# Patient Record
Sex: Female | Born: 1955 | Hispanic: No | Marital: Single | Smoking: Former smoker
Health system: Southern US, Community
[De-identification: ages and names within clinical notes are randomized; demographics above are authoritative.]

## PROBLEM LIST (undated history)

## (undated) DIAGNOSIS — J449 Chronic obstructive pulmonary disease, unspecified: Secondary | ICD-10-CM

## (undated) DIAGNOSIS — E079 Disorder of thyroid, unspecified: Secondary | ICD-10-CM

---

## 2002-10-07 ENCOUNTER — Other Ambulatory Visit: Admission: RE | Admit: 2002-10-07 | Discharge: 2002-10-07 | Payer: Self-pay | Admitting: Family Medicine

## 2020-09-09 ENCOUNTER — Other Ambulatory Visit: Payer: Self-pay

## 2020-09-09 ENCOUNTER — Inpatient Hospital Stay (HOSPITAL_COMMUNITY)
Admission: EM | Admit: 2020-09-09 | Discharge: 2020-09-24 | DRG: 177 | Disposition: A | Discharge status: Home / Self Care | Payer: BC Managed Care – PPO | Attending: Internal Medicine | Admitting: Internal Medicine

## 2020-09-09 ENCOUNTER — Encounter (HOSPITAL_COMMUNITY): Payer: Self-pay | Admitting: Emergency Medicine

## 2020-09-09 ENCOUNTER — Emergency Department (HOSPITAL_COMMUNITY): Payer: BC Managed Care – PPO

## 2020-09-09 DIAGNOSIS — Z79899 Other long term (current) drug therapy: Secondary | ICD-10-CM

## 2020-09-09 DIAGNOSIS — J440 Chronic obstructive pulmonary disease with acute lower respiratory infection: Secondary | ICD-10-CM | POA: Diagnosis present

## 2020-09-09 DIAGNOSIS — R0902 Hypoxemia: Secondary | ICD-10-CM

## 2020-09-09 DIAGNOSIS — J1282 Pneumonia due to coronavirus disease 2019: Secondary | ICD-10-CM | POA: Diagnosis present

## 2020-09-09 DIAGNOSIS — Z79890 Hormone replacement therapy (postmenopausal): Secondary | ICD-10-CM | POA: Diagnosis not present

## 2020-09-09 DIAGNOSIS — E079 Disorder of thyroid, unspecified: Secondary | ICD-10-CM | POA: Diagnosis present

## 2020-09-09 DIAGNOSIS — Z7952 Long term (current) use of systemic steroids: Secondary | ICD-10-CM

## 2020-09-09 DIAGNOSIS — Z885 Allergy status to narcotic agent status: Secondary | ICD-10-CM

## 2020-09-09 DIAGNOSIS — E039 Hypothyroidism, unspecified: Secondary | ICD-10-CM | POA: Diagnosis present

## 2020-09-09 DIAGNOSIS — U071 COVID-19: Principal | ICD-10-CM

## 2020-09-09 DIAGNOSIS — Z7951 Long term (current) use of inhaled steroids: Secondary | ICD-10-CM | POA: Diagnosis not present

## 2020-09-09 DIAGNOSIS — J9601 Acute respiratory failure with hypoxia: Secondary | ICD-10-CM | POA: Diagnosis present

## 2020-09-09 DIAGNOSIS — Z87891 Personal history of nicotine dependence: Secondary | ICD-10-CM

## 2020-09-09 HISTORY — DX: Disorder of thyroid, unspecified: E07.9

## 2020-09-09 HISTORY — DX: Chronic obstructive pulmonary disease, unspecified: J44.9

## 2020-09-09 LAB — TROPONIN I (HIGH SENSITIVITY)
Troponin I (High Sensitivity): 5 ng/L (ref <18)
Troponin I (High Sensitivity): 4 ng/L (ref <18)

## 2020-09-09 LAB — COMPREHENSIVE METABOLIC PANEL
ALT: 22 U/L (ref 0–44)
AST: 18 U/L (ref 15–41)
Albumin: 3.4 g/dL — ABNORMAL LOW (ref 3.5–5.0)
Alkaline Phosphatase: 53 U/L (ref 38–126)
Anion gap: 10 (ref 5–15)
BUN: 20 mg/dL (ref 8–23)
CO2: 23 mmol/L (ref 22–32)
Calcium: 8.7 mg/dL — ABNORMAL LOW (ref 8.9–10.3)
Chloride: 100 mmol/L (ref 98–111)
Creatinine, Ser: 0.87 mg/dL (ref 0.44–1.00)
GFR, Estimated: >60 mL/min (ref >60)
Glucose, Bld: 116 mg/dL — ABNORMAL HIGH (ref 70–99)
Potassium: 4.3 mmol/L (ref 3.5–5.1)
Sodium: 133 mmol/L — ABNORMAL LOW (ref 135–145)
Total Bilirubin: 0.7 mg/dL (ref 0.3–1.2)
Total Protein: 7.5 g/dL (ref 6.5–8.1)

## 2020-09-09 LAB — CBC WITH DIFFERENTIAL/PLATELET
Abs Immature Granulocytes: 0.02 10*3/uL (ref 0.00–0.07)
Basophils Absolute: 0 10*3/uL (ref 0.0–0.1)
Basophils Relative: 0 %
Eosinophils Absolute: 0 10*3/uL (ref 0.0–0.5)
Eosinophils Relative: 0 %
HCT: 36.7 % (ref 36.0–46.0)
Hemoglobin: 12.1 g/dL (ref 12.0–15.0)
Immature Granulocytes: 0 %
Lymphocytes Relative: 12 %
Lymphs Abs: 0.6 10*3/uL — ABNORMAL LOW (ref 0.7–4.0)
MCH: 31.8 pg (ref 26.0–34.0)
MCHC: 33 g/dL (ref 30.0–36.0)
MCV: 96.6 fL (ref 80.0–100.0)
Monocytes Absolute: 0.4 10*3/uL (ref 0.1–1.0)
Monocytes Relative: 7 %
Neutro Abs: 3.9 10*3/uL (ref 1.7–7.7)
Neutrophils Relative %: 81 %
Platelets: 162 10*3/uL (ref 150–400)
RBC: 3.8 MIL/uL — ABNORMAL LOW (ref 3.87–5.11)
RDW: 13.4 % (ref 11.5–15.5)
WBC: 4.8 10*3/uL (ref 4.0–10.5)
nRBC: 0 % (ref 0.0–0.2)

## 2020-09-09 LAB — RESP PANEL BY RT-PCR (FLU A&B, COVID) ARPGX2
Influenza A by PCR: NEGATIVE
Influenza B by PCR: NEGATIVE
SARS Coronavirus 2 by RT PCR: POSITIVE — AB

## 2020-09-09 LAB — POC SARS CORONAVIRUS 2 AG -  ED: SARS Coronavirus 2 Ag: NEGATIVE

## 2020-09-09 LAB — C-REACTIVE PROTEIN: CRP: 7.8 mg/dL — ABNORMAL HIGH (ref <1.0)

## 2020-09-09 LAB — PROCALCITONIN: Procalcitonin: 0.11 ng/mL

## 2020-09-09 LAB — FERRITIN: Ferritin: 323 ng/mL — ABNORMAL HIGH (ref 11–307)

## 2020-09-09 LAB — D-DIMER, QUANTITATIVE: D-Dimer, Quant: 0.76 ug/mL-FEU — ABNORMAL HIGH (ref 0.00–0.50)

## 2020-09-09 LAB — FIBRINOGEN: Fibrinogen: 581 mg/dL — ABNORMAL HIGH (ref 210–475)

## 2020-09-09 MED ORDER — SODIUM CHLORIDE 0.9 % IV SOLN
100.0000 mg | INTRAVENOUS | Status: AC
  Administered 2020-09-10 (×2): 100 mg via INTRAVENOUS
  Filled 2020-09-09 (×2): qty 20

## 2020-09-09 MED ORDER — PREDNISONE 20 MG PO TABS
50.0000 mg | ORAL_TABLET | Freq: Every day | ORAL | Status: DC
  Administered 2020-09-13 – 2020-09-15 (×3): 50 mg via ORAL
  Filled 2020-09-09 (×3): qty 1

## 2020-09-09 MED ORDER — ONDANSETRON HCL 4 MG PO TABS: 4.0000 mg | ORAL_TABLET | Freq: Four times a day (QID) | ORAL | Status: DC | PRN

## 2020-09-09 MED ORDER — SODIUM CHLORIDE 0.9 % IV SOLN: 100.0000 mg | Freq: Every day | INTRAVENOUS | Status: DC

## 2020-09-09 MED ORDER — METHYLPREDNISOLONE SODIUM SUCC 125 MG IJ SOLR
125.0000 mg | Freq: Once | INTRAMUSCULAR | Status: AC
  Administered 2020-09-09: 125 mg via INTRAVENOUS
  Filled 2020-09-09: qty 2

## 2020-09-09 MED ORDER — ACETAMINOPHEN 325 MG PO TABS
650.0000 mg | ORAL_TABLET | Freq: Once | ORAL | Status: AC | PRN
  Administered 2020-09-09: 650 mg via ORAL
  Filled 2020-09-09: qty 2

## 2020-09-09 MED ORDER — ENOXAPARIN SODIUM 40 MG/0.4ML ~~LOC~~ SOLN
40.0000 mg | SUBCUTANEOUS | Status: DC
  Administered 2020-09-10 – 2020-09-23 (×15): 40 mg via SUBCUTANEOUS
  Filled 2020-09-09 (×15): qty 0.4

## 2020-09-09 MED ORDER — ONDANSETRON HCL 4 MG/2ML IJ SOLN: 4.0000 mg | Freq: Four times a day (QID) | INTRAMUSCULAR | Status: DC | PRN

## 2020-09-09 MED ORDER — SODIUM CHLORIDE 0.9 % IV SOLN: 200.0000 mg | Freq: Once | INTRAVENOUS | Status: DC

## 2020-09-09 MED ORDER — SODIUM CHLORIDE 0.9 % IV SOLN
100.0000 mg | Freq: Every day | INTRAVENOUS | Status: AC
  Administered 2020-09-10 – 2020-09-13 (×4): 100 mg via INTRAVENOUS
  Filled 2020-09-09 (×4): qty 20

## 2020-09-09 MED ORDER — METHYLPREDNISOLONE SODIUM SUCC 125 MG IJ SOLR
1.0000 mg/kg | Freq: Two times a day (BID) | INTRAMUSCULAR | Status: AC
  Administered 2020-09-10 – 2020-09-12 (×5): 72.5 mg via INTRAVENOUS
  Filled 2020-09-09 (×5): qty 2

## 2020-09-09 MED ORDER — BARICITINIB 2 MG PO TABS
4.0000 mg | ORAL_TABLET | Freq: Every day | ORAL | Status: AC
  Administered 2020-09-10 – 2020-09-22 (×14): 4 mg via ORAL
  Filled 2020-09-09 (×14): qty 2

## 2020-09-09 MED ORDER — ACETAMINOPHEN 325 MG PO TABS: 650.0000 mg | ORAL_TABLET | Freq: Four times a day (QID) | ORAL | Status: DC | PRN

## 2020-09-09 NOTE — ED Provider Notes (Signed)
[facility] EMERGENCY DEPARTMENT Provider Note   CSN: [id number] Arrival date & time: 09/09/20  1514     History Chief Complaint  Patient presents with  . Hypoxia    Sarah Choi is a 65 y.o. female.  HPI Patient with known COPD presenting with cough, shortness of breath and chills for 1 week.  She states she had a flu immunization but not Covid immunizations.  She does not use oxygen at home.  No known sick contacts.  There are no other known modifying factors.    Past Medical History:  Diagnosis Date  . COPD (chronic obstructive pulmonary disease) ([place])   . Thyroid disease     There are no problems to display for this patient.   History reviewed. No pertinent surgical history.   OB History   No obstetric history on file.     History reviewed. No pertinent family history.  Social History   Tobacco Use  . Smoking status: Former Smoker    Packs/day: 0.50    Years: 31.00    Pack years: 15.50    Types: Cigarettes  . Smokeless tobacco: Never Used  . Tobacco comment: quit 15 yrs ago  Vaping Use  . Vaping Use: Never used  Substance Use Topics  . Alcohol use: Not Currently  . Drug use: Not Currently    Home Medications Prior to Admission medications   Not on File    Allergies    Codeine  Review of Systems   Review of Systems  All other systems reviewed and are negative.   Physical Exam Updated Vital Signs BP 116/61   Pulse 78   Temp 98.9 F (37.2 C) (Oral)   Resp 19   Ht 5' (1.524 m)   Wt 72.6 kg   SpO2 (!) 89%   BMI 31.25 kg/m   Physical Exam Vitals and nursing note reviewed.  Constitutional:      Appearance: She is well-developed and well-nourished.  HENT:     Head: Normocephalic and atraumatic.     Right Ear: External ear normal.     Left Ear: External ear normal.  Eyes:     Extraocular Movements: EOM normal.     Conjunctiva/sclera: Conjunctivae normal.     Pupils: Pupils are equal, round, and reactive to light.  Neck:      Trachea: Phonation normal.  Cardiovascular:     Rate and Rhythm: Normal rate.  Pulmonary:     Effort: Pulmonary effort is normal.  Chest:     Chest wall: No bony tenderness.  Abdominal:     General: There is no distension.  Musculoskeletal:        General: Normal range of motion.     Cervical back: Normal range of motion and neck supple.  Skin:    General: Skin is warm, dry and intact.  Neurological:     Mental Status: She is alert and oriented to person, place, and time.     Cranial Nerves: No cranial nerve deficit.     Sensory: No sensory deficit.     Motor: No abnormal muscle tone.     Coordination: Coordination normal.  Psychiatric:        Mood and Affect: Mood and affect and mood normal.        Behavior: Behavior normal.        Thought Content: Thought content normal.        Judgment: Judgment normal.     ED Results / Procedures / Treatments  Labs (all labs ordered are listed, but only abnormal results are displayed) Labs Reviewed  RESP PANEL BY RT-PCR (FLU A&B, COVID) ARPGX2 - Abnormal; Notable for the following components:      Result Value   SARS Coronavirus 2 by RT PCR POSITIVE (*)    All other components within normal limits  COMPREHENSIVE METABOLIC PANEL - Abnormal; Notable for the following components:   Sodium 133 (*)    Glucose, Bld 116 (*)    Calcium 8.7 (*)    Albumin 3.4 (*)    All other components within normal limits  CBC WITH DIFFERENTIAL/PLATELET - Abnormal; Notable for the following components:   RBC 3.80 (*)    Lymphs Abs 0.6 (*)    All other components within normal limits  FERRITIN - Abnormal; Notable for the following components:   Ferritin 323 (*)    All other components within normal limits  FIBRINOGEN - Abnormal; Notable for the following components:   Fibrinogen 581 (*)    All other components within normal limits  PROCALCITONIN  HIV ANTIBODY (ROUTINE TESTING W REFLEX)  POC SARS CORONAVIRUS 2 AG -  ED  TROPONIN I (HIGH SENSITIVITY)   TROPONIN I (HIGH SENSITIVITY)    EKG EKG Interpretation  Date/Time:  Thursday September 09 2020 15:55:58 EST Ventricular Rate:  101 PR Interval:  128 QRS Duration: 80 QT Interval:  328 QTC Calculation: 425 R Axis:   30 Text Interpretation: Sinus tachycardia Nonspecific ST and T wave abnormality Abnormal ECG No old tracing to compare Confirmed by Mancel Bale [phone]) on 09/09/2020 4:13:30 PM   Radiology DG Chest Port 1 View  Result Date: 09/09/2020 CLINICAL DATA:  Shortness of breath and cough beginning 10 days ago, history of COPD, former smoker EXAM: PORTABLE CHEST 1 VIEW COMPARISON:  Portable exam 1628 hours without priors available for comparison FINDINGS: Normal heart size, mediastinal contours, and pulmonary vascularity. Scattered infiltrates throughout both lungs, could represent pulmonary edema or atypical infection. Minimal central peribronchial thickening. No pleural effusion or pneumothorax. Osseous structures unremarkable. IMPRESSION: Scattered interstitial infiltrates throughout both lungs, question pulmonary edema versus atypical infection. Electronically Signed   By: Ulyses Southward M.D.   On: 09/09/2020 16:37    Procedures Procedures (including critical care time)  Medications Ordered in ED Medications  methylPREDNISolone sodium succinate (SOLU-MEDROL) 125 mg/2 mL injection 125 mg (has no administration in time range)  acetaminophen (TYLENOL) tablet 650 mg (650 mg Oral Given 09/09/20 1609)    ED Course  I have reviewed the triage vital signs and the nursing notes.  Pertinent labs & imaging results that were available during my care of the patient were reviewed by me and considered in my medical decision making (see chart for details).  Clinical Course as of 09/09/20 2215  Thu Sep 09, 2020  1930 Trial off oxygen, she desaturated to 77% on room air [EW]    Clinical Course User Index [EW] Mancel Bale, MD   MDM Rules/Calculators/A&P                            Patient Vitals for the past 24 hrs:  BP Temp Temp src Pulse Resp SpO2 Height Weight  09/09/20 2200 116/61 - - 78 19 (!) 89 % - -  09/09/20 2130 114/69 - - 79 19 (!) 88 % - -  09/09/20 2100 108/68 - - 82 15 (!) 89 % - -  09/09/20 2030 (!) 110/91 - - 79 (!) 21 Marland Kitchen)  84 % - -  09/09/20 2000 109/81 98.9 F (37.2 C) Oral 72 (!) 22 (!) 88 % - -  09/09/20 1910 109/79 - - 78 (!) 22 (!) 77 % - -  09/09/20 1552 - - - - - (!) 82 % - -  09/09/20 1544 (!) 100/59 (!) 101.3 F (38.5 C) Oral (!) 102 18 (!) 88 % 5' (1.524 m) 72.6 kg    10:15 PM Reevaluation with update and discussion. After initial assessment and treatment, an updated evaluation reveals no change in status, oxygenation okay 89% on nasal cannula oxygen at 4 L. Mancel Bale   Medical Decision Making:  This patient is presenting for evaluation of shortness of breath, cough, fever, which does require a range of treatment options, and is a complaint that involves a high risk of morbidity and mortality. The differential diagnoses include heart failure, pneumonia, COPD exacerbation. I decided to review old records, and in summary elderly female with history of COPD, not on oxygen presenting with hypoxia, and signs and symptoms of viral infection.  I did not require additional historical information from anyone.  Clinical Laboratory Tests Ordered, included CBC, Metabolic panel and Covid test, flu test, Covid inflammatory markers. Review indicates Covid infection present, mild inflammatory markers elevated. Radiologic Tests Ordered, included chest x-ray.  I independently Visualized: Radiographic images, which show multifocal pneumonia   Critical Interventions-clinical evaluation, oxygen to support normal oxygen saturation, laboratory testing, chest x-ray, observation, medication treatment, reassessment  After These Interventions, the Patient was reevaluated and was found to require hospitalization for hypoxia secondary to Covid infection.   Patient's been ill about 10 days now.  Anticipate she can be discharged on oxygen as soon as it is made available to her home  Sarah Choi was evaluated in Emergency Department on 09/09/2020 for the symptoms described in the history of present illness. She was evaluated in the context of the global COVID-19 pandemic, which necessitated consideration that the patient might be at risk for infection with the SARS-CoV-2 virus that causes COVID-19. Institutional protocols and algorithms that pertain to the evaluation of patients at risk for COVID-19 are in a state of rapid change based on information released by regulatory bodies including the CDC and federal and state organizations. These policies and algorithms were followed during the patient's care in the ED.   CRITICAL CARE-no Performed by: Mancel Bale  Nursing Notes Reviewed/ Care Coordinated Applicable Imaging Reviewed Interpretation of Laboratory Data incorporated into ED treatment  10:10 PM-Consult complete with hospitalist. Patient case explained and discussed.  He agrees to admit patient for further evaluation and treatment. Call ended at 10:50 PM    Final Clinical Impression(s) / ED Diagnoses Final diagnoses:  COVID-19 virus infection  Hypoxia    Rx / DC Orders ED Discharge Orders    None       Mancel Bale, MD 09/09/20 2216

## 2020-09-09 NOTE — ED Notes (Signed)
CRITICAL VALUE ALERT  Critical Value:  COVID POSITIVE  Date & Time Notied:  09/09/20 & 1750  Provider Notified: Dr. Effie Shy  Orders Received/Actions taken: Notified

## 2020-09-09 NOTE — ED Triage Notes (Addendum)
Pt c/o shortness of breath with a cough that began 10 days ago.  Pt has a hx of COPD and Oxygen saturations in the low 80's on RA.  Pt placed on 4L and sats rose to 90.

## 2020-09-10 ENCOUNTER — Other Ambulatory Visit: Payer: Self-pay

## 2020-09-10 ENCOUNTER — Encounter (HOSPITAL_COMMUNITY): Payer: Self-pay | Admitting: Internal Medicine

## 2020-09-10 DIAGNOSIS — J9601 Acute respiratory failure with hypoxia: Secondary | ICD-10-CM

## 2020-09-10 DIAGNOSIS — U071 COVID-19: Principal | ICD-10-CM

## 2020-09-10 LAB — HIV ANTIBODY (ROUTINE TESTING W REFLEX): HIV Screen 4th Generation wRfx: NONREACTIVE

## 2020-09-10 LAB — CBC WITH DIFFERENTIAL/PLATELET
Abs Immature Granulocytes: 0.01 10*3/uL (ref 0.00–0.07)
Basophils Absolute: 0 10*3/uL (ref 0.0–0.1)
Basophils Relative: 0 %
Eosinophils Absolute: 0 10*3/uL (ref 0.0–0.5)
Eosinophils Relative: 0 %
HCT: 38 % (ref 36.0–46.0)
Hemoglobin: 12.4 g/dL (ref 12.0–15.0)
Immature Granulocytes: 0 %
Lymphocytes Relative: 15 %
Lymphs Abs: 0.5 10*3/uL — ABNORMAL LOW (ref 0.7–4.0)
MCH: 31.6 pg (ref 26.0–34.0)
MCHC: 32.6 g/dL (ref 30.0–36.0)
MCV: 96.9 fL (ref 80.0–100.0)
Monocytes Absolute: 0.1 10*3/uL (ref 0.1–1.0)
Monocytes Relative: 3 %
Neutro Abs: 2.7 10*3/uL (ref 1.7–7.7)
Neutrophils Relative %: 82 %
Platelets: 153 10*3/uL (ref 150–400)
RBC: 3.92 MIL/uL (ref 3.87–5.11)
RDW: 13.3 % (ref 11.5–15.5)
WBC: 3.3 10*3/uL — ABNORMAL LOW (ref 4.0–10.5)
nRBC: 0 % (ref 0.0–0.2)

## 2020-09-10 LAB — COMPREHENSIVE METABOLIC PANEL
ALT: 18 U/L (ref 0–44)
AST: 17 U/L (ref 15–41)
Albumin: 3.3 g/dL — ABNORMAL LOW (ref 3.5–5.0)
Alkaline Phosphatase: 51 U/L (ref 38–126)
Anion gap: 11 (ref 5–15)
BUN: 18 mg/dL (ref 8–23)
CO2: 24 mmol/L (ref 22–32)
Calcium: 8.8 mg/dL — ABNORMAL LOW (ref 8.9–10.3)
Chloride: 104 mmol/L (ref 98–111)
Creatinine, Ser: 0.66 mg/dL (ref 0.44–1.00)
GFR, Estimated: >60 mL/min (ref >60)
Glucose, Bld: 160 mg/dL — ABNORMAL HIGH (ref 70–99)
Potassium: 4.1 mmol/L (ref 3.5–5.1)
Sodium: 139 mmol/L (ref 135–145)
Total Bilirubin: 0.5 mg/dL (ref 0.3–1.2)
Total Protein: 7.1 g/dL (ref 6.5–8.1)

## 2020-09-10 LAB — D-DIMER, QUANTITATIVE: D-Dimer, Quant: 0.7 ug/mL-FEU — ABNORMAL HIGH (ref 0.00–0.50)

## 2020-09-10 LAB — C-REACTIVE PROTEIN: CRP: 9.2 mg/dL — ABNORMAL HIGH (ref <1.0)

## 2020-09-10 LAB — PROCALCITONIN: Procalcitonin: 0.1 ng/mL

## 2020-09-10 MED ORDER — FLUTICASONE FUROATE-VILANTEROL 200-25 MCG/INH IN AEPB
1.0000 | INHALATION_SPRAY | Freq: Every day | RESPIRATORY_TRACT | Status: DC
  Administered 2020-09-10 – 2020-09-24 (×15): 1 via RESPIRATORY_TRACT

## 2020-09-10 MED ORDER — ASCORBIC ACID 500 MG PO TABS
500.0000 mg | ORAL_TABLET | Freq: Two times a day (BID) | ORAL | Status: DC
  Administered 2020-09-10 – 2020-09-24 (×28): 500 mg via ORAL
  Filled 2020-09-10 (×29): qty 1

## 2020-09-10 MED ORDER — ALBUTEROL SULFATE HFA 108 (90 BASE) MCG/ACT IN AERS: 2.0000 | INHALATION_SPRAY | RESPIRATORY_TRACT | Status: DC | PRN

## 2020-09-10 MED ORDER — FLUTICASONE-UMECLIDIN-VILANT 100-62.5-25 MCG/INH IN AEPB: 1.0000 | INHALATION_SPRAY | Freq: Every day | RESPIRATORY_TRACT | Status: DC

## 2020-09-10 MED ORDER — GUAIFENESIN ER 600 MG PO TB12
600.0000 mg | ORAL_TABLET | Freq: Two times a day (BID) | ORAL | Status: DC
  Administered 2020-09-10 – 2020-09-18 (×17): 600 mg via ORAL
  Filled 2020-09-10 (×17): qty 1

## 2020-09-10 MED ORDER — UMECLIDINIUM BROMIDE 62.5 MCG/INH IN AEPB
1.0000 | INHALATION_SPRAY | Freq: Every day | RESPIRATORY_TRACT | Status: DC
  Administered 2020-09-10 – 2020-09-24 (×15): 1 via RESPIRATORY_TRACT
  Filled 2020-09-10 (×2): qty 7

## 2020-09-10 MED ORDER — SODIUM CHLORIDE 0.9 % IV SOLN
INTRAVENOUS | Status: DC | PRN
  Administered 2020-09-10: 250 mL via INTRAVENOUS

## 2020-09-10 MED ORDER — PANTOPRAZOLE SODIUM 40 MG PO TBEC
40.0000 mg | DELAYED_RELEASE_TABLET | Freq: Every day | ORAL | Status: DC
  Administered 2020-09-10 – 2020-09-24 (×15): 40 mg via ORAL
  Filled 2020-09-10 (×15): qty 1

## 2020-09-10 MED ORDER — LEVOTHYROXINE SODIUM 112 MCG PO TABS
112.0000 ug | ORAL_TABLET | Freq: Every day | ORAL | Status: DC
  Administered 2020-09-10 – 2020-09-24 (×15): 112 ug via ORAL
  Filled 2020-09-10 (×15): qty 1

## 2020-09-10 MED ORDER — ZINC SULFATE 220 (50 ZN) MG PO CAPS
220.0000 mg | ORAL_CAPSULE | Freq: Every day | ORAL | Status: DC
  Administered 2020-09-10 – 2020-09-24 (×15): 220 mg via ORAL
  Filled 2020-09-10 (×15): qty 1

## 2020-09-10 MED ORDER — FLUTICASONE FUROATE-VILANTEROL 100-25 MCG/INH IN AEPB
1.0000 | INHALATION_SPRAY | Freq: Every day | RESPIRATORY_TRACT | Status: DC
  Filled 2020-09-10: qty 28

## 2020-09-10 NOTE — Progress Notes (Signed)
  Patient seen and evaluated, chart reviewed, please see EMR for updated orders. Please see full H&P dictated by admitting physician Dr Lyda Perone for same date of service.   Brief Summary:- 65 year old female with past medical history relevant for hypothyroidism and COPD, not previously on home O2 and not vaccinated against COVID-19 admitted on 09/10/2020 with acute hypoxic respiratory failure secondary to COVID-19 respiratory infection/Covid pneumonia   A/p  Acute hypoxic respiratory failure secondary to COVID-19 infection/Pneumonia--- The treatment plan and use of medications  for treatment of COVID-19 infection and possible side effects were discussed with patient -----Patient  verbalizes understanding and agrees to treatment protocols   --Patient is positive for COVID-19 infection, chest x-ray with findings of infiltrates/opacities,  patient is tachypneic/hypoxic and requiring continuous supplemental oxygen---patient meets criteria for initiation of Remdesivir AND Steroid therapy per protocol  --Check and trend inflammatory markers including D-dimer, ferritin and  CRP---also follow CBC and CMP --Supplemental oxygen to keep O2 sats above 93% -Follow serial chest x-rays and ABGs as indicated --- Encourage prone positioning for More than 16 hours/day in increments of 2 to 3 hours at a time if able to tolerate --Attempt to maintain euvolemic state --Zinc and vitamin C as ordered -Albuterol inhaler as needed -Accu-Cheks/fingersticks while on high-dose steroids -PPI while on high-dose steroids -Continue IV Solu-Medrol, p.o. baricitinib and IV remdesivir -Hypoxia persist patient is currently requiring high flow nasal cannula >> 10 Liters COVID-19 Labs  Recent Labs    09/09/20 2004 09/10/20 0506  DDIMER 0.76* 0.70*  FERRITIN 323*  --   CRP 7.8* 9.2*    Lab Results  Component Value Date   SARSCOV2NAA POSITIVE (A) 09/09/2020    2) hypothyroidism--continue levothyroxine 112 mcg  daily  3) social/ethics--patient is a full code with full scope of treatment  4)COPD--- steroids and bronchodilators as above in #1  --Total care time 42 minutes  Patient seen and evaluated, chart reviewed, please see EMR for updated orders. Please see full H&P dictated by admitting physician Dr Lyda Perone for same date of service.

## 2020-09-10 NOTE — H&P (Signed)
History and Physical    Sarah Choi RFF:[id number] DOB: 02-20-1956 DOA: 09/09/2020  PCP: [facility]  Patient coming from: Home  I have personally briefly reviewed patient's old medical records in [place]  Chief Complaint: Hypoxia  HPI: Sarah Choi is a 65 y.o. female with medical history significant of COPD, hypothyroidism.  Pt unvaccinated to [place].  Pt presents to ED with c/o cough, SOB, fever, chills for past 1 week.  Not normally on O2 at home.  No known sick contacts.  Symptoms are constant, severe, worsening.  Nothing makes better or worse.  No abd pain, no N/V/D.   ED Course: Initially 4L requirement, but now 81% on 4L, Improved to 89% on 8L.  Procalcitonin neg, WBC nl.  CRP 7.8.  D.Dimer 0.76.  CXR shows multifocal PNA.  COVID positive.   Review of Systems: As per HPI, otherwise all review of systems negative.  Past Medical History:  Diagnosis Date  . COPD (chronic obstructive pulmonary disease) ([place])   . Thyroid disease     History reviewed. No pertinent surgical history.   reports that she has quit smoking. Her smoking use included cigarettes. She has a 15.50 pack-year smoking history. She has never used smokeless tobacco. She reports previous alcohol use. She reports previous drug use.  Allergies  Allergen Reactions  . Codeine Other (See Comments)    Other reaction(s): Headache    History reviewed. No pertinent family history. No sick contacts.  Prior to Admission medications   Medication Sig Start Date End Date Taking? Authorizing Provider  azithromycin (ZITHROMAX) 250 MG tablet Take 250 mg by mouth as directed. 09/07/20  Yes [provider]  levothyroxine (SYNTHROID) 112 MCG tablet Take 112 mcg by mouth daily. 06/20/20  Yes [provider]  predniSONE (DELTASONE) 5 MG tablet Take 5 mg by mouth taper from 4 doses each day to 1 dose and stop. 09/07/20  Yes [provider]  TRELEGY ELLIPTA  100-62.5-25 MCG/INH AEPB Inhale 1 puff into the lungs daily. 08/15/20  Yes [provider]    Physical Exam: Vitals:   09/09/20 2100 09/09/20 2130 09/09/20 2200 09/09/20 2230  BP: 108/68 114/69 116/61 (!) 115/55  Pulse: 82 79 78 84  Resp: 15 19 19    Temp:      TempSrc:      SpO2: (!) 89% (!) 88% (!) 89% (!) 85%  Weight:      Height:        Constitutional: NAD, calm, comfortable Eyes: PERRL, lids and conjunctivae normal ENMT: Mucous membranes are moist. Posterior pharynx clear of any exudate or lesions.Normal dentition.  Neck: normal, supple, no masses, no thyromegaly Respiratory: clear to auscultation bilaterally, no wheezing, no crackles. Normal respiratory effort. No accessory muscle use.  Cardiovascular: Regular rate and rhythm, no murmurs / rubs / gallops. No extremity edema. 2+ pedal pulses. No carotid bruits.  Abdomen: no tenderness, no masses palpated. No hepatosplenomegaly. Bowel sounds positive.  Musculoskeletal: no clubbing / cyanosis. No joint deformity upper and lower extremities. Good ROM, no contractures. Normal muscle tone.  Skin: no rashes, lesions, ulcers. No induration Neurologic: CN 2-12 grossly intact. Sensation intact, DTR normal. Strength 5/5 in all 4.  Psychiatric: Normal judgment and insight. Alert and oriented x 3. Normal mood.    Labs on Admission: I have personally reviewed following labs and imaging studies  CBC: Recent Labs  Lab 09/09/20 2004  WBC 4.8  NEUTROABS 3.9  HGB 12.1  HCT 36.7  MCV 96.6  PLT 162   Basic Metabolic Panel: Recent Labs  Lab 09/09/20 2004  NA 133*  K 4.3  CL 100  CO2 23  GLUCOSE 116*  BUN 20  CREATININE 0.87  CALCIUM 8.7*   GFR: Estimated Creatinine Clearance: 58.1 mL/min (by C-G formula based on SCr of 0.87 mg/dL). Liver Function Tests: Recent Labs  Lab 09/09/20 2004  AST 18  ALT 22  ALKPHOS 53  BILITOT 0.7  PROT 7.5  ALBUMIN 3.4*   No results for input(s): LIPASE, AMYLASE in the last 168  hours. No results for input(s): AMMONIA in the last 168 hours. Coagulation Profile: No results for input(s): INR, PROTIME in the last 168 hours. Cardiac Enzymes: No results for input(s): CKTOTAL, CKMB, CKMBINDEX, TROPONINI in the last 168 hours. BNP (last 3 results) No results for input(s): PROBNP in the last 8760 hours. HbA1C: No results for input(s): HGBA1C in the last 72 hours. CBG: No results for input(s): GLUCAP in the last 168 hours. Lipid Profile: No results for input(s): CHOL, HDL, LDLCALC, TRIG, CHOLHDL, LDLDIRECT in the last 72 hours. Thyroid Function Tests: No results for input(s): TSH, T4TOTAL, FREET4, T3FREE, THYROIDAB in the last 72 hours. Anemia Panel: Recent Labs    09/09/20 2004  FERRITIN 323*   Urine analysis: No results found for: COLORURINE, APPEARANCEUR, LABSPEC, PHURINE, GLUCOSEU, HGBUR, BILIRUBINUR, KETONESUR, PROTEINUR, UROBILINOGEN, NITRITE, LEUKOCYTESUR  Radiological Exams on Admission: DG Chest Port 1 View  Result Date: 09/09/2020 CLINICAL DATA:  Shortness of breath and cough beginning 10 days ago, history of COPD, former smoker EXAM: PORTABLE CHEST 1 VIEW COMPARISON:  Portable exam 1628 hours without priors available for comparison FINDINGS: Normal heart size, mediastinal contours, and pulmonary vascularity. Scattered infiltrates throughout both lungs, could represent pulmonary edema or atypical infection. Minimal central peribronchial thickening. No pleural effusion or pneumothorax. Osseous structures unremarkable. IMPRESSION: Scattered interstitial infiltrates throughout both lungs, question pulmonary edema versus atypical infection. Electronically Signed   By: Ulyses Southward M.D.   On: 09/09/2020 16:37    EKG: Independently reviewed.  Assessment/Plan Principal Problem:   Acute hypoxemic respiratory failure due to COVID-19 (HCC)    1. Acute hypoxic resp failure due to COVID-19 1. COVID pathway 2. Cont pulse ox 3. remdesivir 4. Steroids 5. Starting  baricitinib given she is needing at least 8L at the moment. 6. Procalcitonin neg - doubt superimposed bacterial infection 7. Daily labs ordered  DVT prophylaxis: Lovenox Code Status: Full Family Communication: No family in room Disposition Plan: Home after O2 requirement improved Consults called: None Admission status: Admit to inpatient  Severity of Illness: The appropriate patient status for this patient is INPATIENT. Inpatient status is judged to be reasonable and necessary in order to provide the required intensity of service to ensure the patient's safety. The patient's presenting symptoms, physical exam findings, and initial radiographic and laboratory data in the context of their chronic comorbidities is felt to place them at high risk for further clinical deterioration. Furthermore, it is not anticipated that the patient will be medically stable for discharge from the hospital within 2 midnights of admission. The following factors support the patient status of inpatient.   IP status due to COVID-19 with 8L O2 requirement.  * I certify that at the point of admission it is my clinical judgment that the patient will require inpatient hospital care spanning beyond 2 midnights from the point of admission due to high intensity of service, high risk for further deterioration and high frequency of surveillance required.*  Kemari Narez Judie Petit DO [facility]  How to contact the [facility] Attending or Consulting provider 7A - 7P or covering provider during after hours 7P -7A, for this patient?  1. Check the care team in [facility] and look for a) attending/consulting [facility] provider listed and b) the [facility] team listed 2. Log into [email or web address]  Amion Physician Scheduling and messaging for groups and whole hospitals  On call and physician scheduling software for group practices, residents, hospitalists and other medical providers for call, clinic, rotation and shift schedules. On[employer] is a  hospital-wide system for scheduling doctors and paging doctors on call. EasyPlot is for scientific plotting and data analysis.  [email or web address]  and use [place]'s universal password to access. If you do not have the password, please contact the hospital operator.  3. Locate the [facility] provider you are looking for under [facility] and page to a number that you can be directly reached. 4. If you still have difficulty reaching the provider, please page the [facility] (Director on Call) for the Hospitalists listed on amion for assistance.  09/10/2020, 12:00 AM

## 2020-09-11 DIAGNOSIS — J9601 Acute respiratory failure with hypoxia: Secondary | ICD-10-CM | POA: Diagnosis not present

## 2020-09-11 DIAGNOSIS — U071 COVID-19: Secondary | ICD-10-CM | POA: Diagnosis not present

## 2020-09-11 LAB — COMPREHENSIVE METABOLIC PANEL
ALT: 21 U/L (ref 0–44)
AST: 15 U/L (ref 15–41)
Albumin: 3.1 g/dL — ABNORMAL LOW (ref 3.5–5.0)
Alkaline Phosphatase: 49 U/L (ref 38–126)
Anion gap: 9 (ref 5–15)
BUN: 32 mg/dL — ABNORMAL HIGH (ref 8–23)
CO2: 23 mmol/L (ref 22–32)
Calcium: 9 mg/dL (ref 8.9–10.3)
Chloride: 105 mmol/L (ref 98–111)
Creatinine, Ser: 0.79 mg/dL (ref 0.44–1.00)
GFR, Estimated: >60 mL/min (ref >60)
Glucose, Bld: 137 mg/dL — ABNORMAL HIGH (ref 70–99)
Potassium: 3.8 mmol/L (ref 3.5–5.1)
Sodium: 137 mmol/L (ref 135–145)
Total Bilirubin: 0.5 mg/dL (ref 0.3–1.2)
Total Protein: 6.8 g/dL (ref 6.5–8.1)

## 2020-09-11 LAB — C-REACTIVE PROTEIN: CRP: 5.1 mg/dL — ABNORMAL HIGH (ref <1.0)

## 2020-09-11 LAB — CBC WITH DIFFERENTIAL/PLATELET
Abs Immature Granulocytes: 0.02 10*3/uL (ref 0.00–0.07)
Basophils Absolute: 0 10*3/uL (ref 0.0–0.1)
Basophils Relative: 0 %
Eosinophils Absolute: 0 10*3/uL (ref 0.0–0.5)
Eosinophils Relative: 0 %
HCT: 38.7 % (ref 36.0–46.0)
Hemoglobin: 12.4 g/dL (ref 12.0–15.0)
Immature Granulocytes: 0 %
Lymphocytes Relative: 12 %
Lymphs Abs: 0.7 10*3/uL (ref 0.7–4.0)
MCH: 31.2 pg (ref 26.0–34.0)
MCHC: 32 g/dL (ref 30.0–36.0)
MCV: 97.2 fL (ref 80.0–100.0)
Monocytes Absolute: 0.4 10*3/uL (ref 0.1–1.0)
Monocytes Relative: 7 %
Neutro Abs: 4.9 10*3/uL (ref 1.7–7.7)
Neutrophils Relative %: 81 %
Platelets: 221 10*3/uL (ref 150–400)
RBC: 3.98 MIL/uL (ref 3.87–5.11)
RDW: 13.3 % (ref 11.5–15.5)
WBC: 6.1 10*3/uL (ref 4.0–10.5)
nRBC: 0 % (ref 0.0–0.2)

## 2020-09-11 LAB — D-DIMER, QUANTITATIVE: D-Dimer, Quant: 0.58 ug/mL-FEU — ABNORMAL HIGH (ref 0.00–0.50)

## 2020-09-11 NOTE — Progress Notes (Signed)
PROGRESS NOTE  Sarah Choi  DOB: 1955-12-23  PCP: [facility] QIO:[id number]  DOA: 09/09/2020  LOS: 2 days   Chief Complaint  Patient presents with  . Hypoxia    Brief narrative: Sarah Choi is a 65 y.o. female with PMH significant for hypothyroidism, COPD, unvaccinated to Covid. Patient presented to the ED on 1/6 with complaint of shortness of breath and cough that began 10 days ago.   In the ED, oxygen saturation noted to be 80% on room air, required 4 L by nasal cannula to maintain more than 90%. Covid PCR positive.  Procalcitonin negative, CRP elevated Chest x-ray showed scattered interstitial infiltrates throughout both lungs Patient was admitted for respiratory failure secondary to Covid related pneumonia.  Subjective: Patient was seen and examined this morning. Pleasant middle-aged Caucasian female.  Looks older for her age. On 15 L oxygen by nasal cannula, oxygen saturation more than 90%. She states she feels fine.  She regrets not taking vaccine but plans to take it once he manages to ride this illness over.  Assessment/Plan: COVID pneumonia Acute respiratory failure with hypoxia  -Presented with progressively worsening cough, shortness of breath -COVID test: PCR positive on admission -Chest imaging: Multifocal pneumonia  -Treatment: Currently on a 5-day course of IV remdesivir till 1/10, Solu-Medrol IV twice daily.  Since 1/7, patient was also started on baricitinib. -Progression: Seems to have worsening oxygen requirement.  However see states he feels better.  Inflammatory markers gradually improving. -Continue to monitor -Oxygen - SpO2: 92 % O2 Flow Rate (L/min): 15 L/min -Supportive care: Vitamin C, Zinc, PRN inhalers, Tylenol, Antitussives (benzonatate/ Mucinex/Tussionex).   -Encouraged incentive spirometry, prone position, out of bed and early mobilization as much as possible -Continue airborne/contact isolation precautions for duration of 3 weeks  from the day of diagnosis. -WBC and inflammatory markers trend as below.  Recent Labs  Lab 09/09/20 1600 09/09/20 2004 09/09/20 2315 09/10/20 0506 09/11/20 0500  SARSCOV2NAA POSITIVE*  --   --   --   --   WBC  --  4.8  --  3.3* 6.1  PROCALCITON  --  0.11 0.10  --   --   DDIMER  --  0.76*  --  0.70* 0.58*  FERRITIN  --  323*  --   --   --   CRP  --  7.8*  --  9.2* 5.1*  ALT  --  22  --  18 21   The treatment plan and use of medications and known side effects were discussed with patient/family. Some of the medications used are based on case reports/anecdotal data.  All other medications being used in the management of COVID-19 based on limited study data.  Complete risks and long-term side effects are unknown, however in the best clinical judgment they seem to be of some benefit.  Patient wanted to proceed with treatment options provided.  Hypothyroidism -Continue Synthroid  COPD -On steroids and bronchodilators as stated above.  Mobility: Encourage ambulation once oxygenation improves Code Status:   Code Status: Full Code  Nutritional status: Body mass index is 31.25 kg/m.     Diet Order            Diet Heart Room service appropriate? Yes; Fluid consistency: Thin  Diet effective now                 DVT prophylaxis: enoxaparin (LOVENOX) injection 40 mg Start: 09/09/20 2300   Antimicrobials:  None Fluid: None Consultants: None Family Communication:  None  at bedside  Status is: Inpatient  Remains inpatient appropriate because: Ongoing treatment for Covid related pneumonia  Dispo: The patient is from: Home              Anticipated d/c is to: Home              Anticipated d/c date is: > 3 days              Patient currently is not medically stable to d/c.       Infusions:  . sodium chloride 250 mL (09/10/20 1743)  . remdesivir 100 mg in NS 100 mL 100 mg (09/11/20 0916)    Scheduled Meds: . vitamin C  500 mg Oral BID  . baricitinib  4 mg Oral Daily  .  enoxaparin (LOVENOX) injection  40 mg Subcutaneous Q24H  . fluticasone furoate-vilanterol  1 puff Inhalation Daily  . guaiFENesin  600 mg Oral BID  . levothyroxine  112 mcg Oral Daily  . methylPREDNISolone (SOLU-MEDROL) injection  1 mg/kg Intravenous Q12H   Followed by  . [START ON 09/13/2020] predniSONE  50 mg Oral Daily  . pantoprazole  40 mg Oral Daily  . umeclidinium bromide  1 puff Inhalation Daily  . zinc sulfate  220 mg Oral Daily    Antimicrobials: Anti-infectives (From admission, onward)   Start     Dose/Rate Route Frequency Ordered Stop   09/10/20 1800  remdesivir 100 mg in sodium chloride 0.9 % 100 mL IVPB       "Followed by" Linked Group Details   100 mg 200 mL/hr over 30 Minutes Intravenous Daily 09/09/20 2249 09/14/20 0959   09/10/20 1000  remdesivir 100 mg in sodium chloride 0.9 % 100 mL IVPB  Status:  Discontinued       "Followed by" Linked Group Details   100 mg 200 mL/hr over 30 Minutes Intravenous Daily 09/09/20 2247 09/09/20 2248   09/09/20 2300  remdesivir 200 mg in sodium chloride 0.9% 250 mL IVPB  Status:  Discontinued       "Followed by" Linked Group Details   200 mg 580 mL/hr over 30 Minutes Intravenous Once 09/09/20 2247 09/09/20 2248   09/09/20 2300  remdesivir 100 mg in sodium chloride 0.9 % 100 mL IVPB       "Followed by" Linked Group Details   100 mg 200 mL/hr over 30 Minutes Intravenous Every 30 min 09/09/20 2249 09/10/20 0204      PRN meds: sodium chloride, acetaminophen, albuterol, ondansetron **OR** ondansetron (ZOFRAN) IV   Objective: Vitals:   09/11/20 0450 09/11/20 0702  BP: 100/74   Pulse: 78   Resp: 19   Temp: 98 F (36.7 C)   SpO2: 91% 92%    Intake/Output Summary (Last 24 hours) at 09/11/2020 1509 Last data filed at 09/11/2020 1500 Gross per 24 hour  Intake 661.19 ml  Output --  Net 661.19 ml   Filed Weights   09/09/20 1544  Weight: 72.6 kg   Weight change:  Body mass index is 31.25 kg/m.   Physical Exam: General  exam: Pleasant.  Not in physical distress Skin: No rashes, lesions or ulcers. HEENT: Atraumatic, normocephalic, no obvious bleeding Lungs: Clear to auscultation bilaterally CVS: Regular rate and rhythm, no murmur GI/Abd soft, nontender, nondistended, bowel sound present CNS: Alert, awake, oriented x3 Psychiatry: Mood appropriate Extremities: No pedal edema, no calf tenderness  Data Review: I have personally reviewed the laboratory data and studies available.  Recent Labs  Lab 09/09/20 2004 09/10/20  0506 09/11/20 0500  WBC 4.8 3.3* 6.1  NEUTROABS 3.9 2.7 4.9  HGB 12.1 12.4 12.4  HCT 36.7 38.0 38.7  MCV 96.6 96.9 97.2  PLT 162 153 221   Recent Labs  Lab 09/09/20 2004 09/10/20 0506 09/11/20 0500  NA 133* 139 137  K 4.3 4.1 3.8  CL 100 104 105  CO2 23 24 23   GLUCOSE 116* 160* 137*  BUN 20 18 32*  CREATININE 0.87 0.66 0.79  CALCIUM 8.7* 8.8* 9.0    F/u labs ordered  Signed, , MD [facility] 09/11/2020

## 2020-09-11 NOTE — Plan of Care (Signed)

## 2020-09-12 DIAGNOSIS — J9601 Acute respiratory failure with hypoxia: Secondary | ICD-10-CM | POA: Diagnosis not present

## 2020-09-12 DIAGNOSIS — U071 COVID-19: Secondary | ICD-10-CM | POA: Diagnosis not present

## 2020-09-12 LAB — COMPREHENSIVE METABOLIC PANEL
ALT: 25 U/L (ref 0–44)
AST: 17 U/L (ref 15–41)
Albumin: 3.1 g/dL — ABNORMAL LOW (ref 3.5–5.0)
Alkaline Phosphatase: 48 U/L (ref 38–126)
Anion gap: 9 (ref 5–15)
BUN: 33 mg/dL — ABNORMAL HIGH (ref 8–23)
CO2: 23 mmol/L (ref 22–32)
Calcium: 9 mg/dL (ref 8.9–10.3)
Chloride: 109 mmol/L (ref 98–111)
Creatinine, Ser: 0.75 mg/dL (ref 0.44–1.00)
GFR, Estimated: >60 mL/min (ref >60)
Glucose, Bld: 137 mg/dL — ABNORMAL HIGH (ref 70–99)
Potassium: 3.7 mmol/L (ref 3.5–5.1)
Sodium: 141 mmol/L (ref 135–145)
Total Bilirubin: 0.5 mg/dL (ref 0.3–1.2)
Total Protein: 6.6 g/dL (ref 6.5–8.1)

## 2020-09-12 LAB — CBC WITH DIFFERENTIAL/PLATELET
Abs Immature Granulocytes: 0.04 10*3/uL (ref 0.00–0.07)
Basophils Absolute: 0 10*3/uL (ref 0.0–0.1)
Basophils Relative: 0 %
Eosinophils Absolute: 0 10*3/uL (ref 0.0–0.5)
Eosinophils Relative: 0 %
HCT: 38.3 % (ref 36.0–46.0)
Hemoglobin: 12.3 g/dL (ref 12.0–15.0)
Immature Granulocytes: 1 %
Lymphocytes Relative: 10 %
Lymphs Abs: 0.5 10*3/uL — ABNORMAL LOW (ref 0.7–4.0)
MCH: 31.4 pg (ref 26.0–34.0)
MCHC: 32.1 g/dL (ref 30.0–36.0)
MCV: 97.7 fL (ref 80.0–100.0)
Monocytes Absolute: 0.2 10*3/uL (ref 0.1–1.0)
Monocytes Relative: 4 %
Neutro Abs: 4.1 10*3/uL (ref 1.7–7.7)
Neutrophils Relative %: 85 %
Platelets: 227 10*3/uL (ref 150–400)
RBC: 3.92 MIL/uL (ref 3.87–5.11)
RDW: 13.3 % (ref 11.5–15.5)
WBC: 4.9 10*3/uL (ref 4.0–10.5)
nRBC: 0 % (ref 0.0–0.2)

## 2020-09-12 LAB — C-REACTIVE PROTEIN: CRP: 2.4 mg/dL — ABNORMAL HIGH (ref <1.0)

## 2020-09-12 LAB — D-DIMER, QUANTITATIVE: D-Dimer, Quant: 0.62 ug/mL-FEU — ABNORMAL HIGH (ref 0.00–0.50)

## 2020-09-12 LAB — MAGNESIUM: Magnesium: 2.4 mg/dL (ref 1.7–2.4)

## 2020-09-12 LAB — PHOSPHORUS: Phosphorus: 3.8 mg/dL (ref 2.5–4.6)

## 2020-09-12 NOTE — Plan of Care (Signed)

## 2020-09-12 NOTE — Progress Notes (Signed)
PROGRESS NOTE  Sarah Choi  DOB: [birth year]  PCP: [facility] EHU:[id number]  DOA: 09/09/2020  LOS: 3 days   Chief Complaint  Patient presents with  . Hypoxia    Brief narrative: Sarah Choi is a 65 y.o. female with PMH significant for hypothyroidism, COPD, unvaccinated to Covid. Patient presented to the ED on 1/6 with complaint of shortness of breath and cough that began 10 days ago.   In the ED, oxygen saturation noted to be 80% on room air, required 4 L by nasal cannula to maintain more than 90%. Covid PCR positive.  Procalcitonin negative, CRP elevated Chest x-ray showed scattered interstitial infiltrates throughout both lungs Patient was admitted for respiratory failure secondary to Covid related pneumonia.  Subjective: Currently she is on 15 L of oxygen.  She feels that breathing is slowly improving.  She was encouraged to lay in prone position.  She does have a cough.  Assessment/Plan: COVID pneumonia Acute respiratory failure with hypoxia  -Presented with progressively worsening cough, shortness of breath -COVID test: PCR positive on admission -Chest imaging: Multifocal pneumonia  -Treatment: Currently on a 5-day course of IV remdesivir till 1/10, Solu-Medrol IV twice daily.  Since 1/7, patient was also started on baricitinib. -Progression: Currently on 15L HFNC.  Inflammatory markers gradually improving. -Continue to monitor -Oxygen - SpO2: 94 % O2 Flow Rate (L/min): 12 L/min -Supportive care: Vitamin C, Zinc, PRN inhalers, Tylenol, Antitussives (benzonatate/ Mucinex/Tussionex).   -Encouraged incentive spirometry, prone position, out of bed and early mobilization as much as possible -Continue airborne/contact isolation precautions for duration of 3 weeks from the day of diagnosis. -WBC and inflammatory markers trend as below.  Recent Labs  Lab 09/09/20 1600 09/09/20 2004 09/09/20 2315 09/10/20 0506 09/11/20 0500 09/12/20 0959  SARSCOV2NAA  POSITIVE*  --   --   --   --   --   WBC  --  4.8  --  3.3* 6.1 4.9  PROCALCITON  --  0.11 0.10  --   --   --   DDIMER  --  0.76*  --  0.70* 0.58* 0.62*  FERRITIN  --  323*  --   --   --   --   CRP  --  7.8*  --  9.2* 5.1* 2.4*  ALT  --  22  --  18 21 25    The treatment plan and use of medications and known side effects were discussed with patient/family. Some of the medications used are based on case reports/anecdotal data.  All other medications being used in the management of COVID-19 based on limited study data.  Complete risks and long-term side effects are unknown, however in the best clinical judgment they seem to be of some benefit.  Patient wanted to proceed with treatment options provided.  Hypothyroidism -Continue Synthroid  COPD -On steroids and bronchodilators as stated above.  Mobility: Encourage ambulation once oxygenation improves Code Status:   Code Status: Full Code  Nutritional status: Body mass index is 31.25 kg/m.     Diet Order            Diet Heart Room service appropriate? Yes; Fluid consistency: Thin  Diet effective now                 DVT prophylaxis: enoxaparin (LOVENOX) injection 40 mg Start: 09/09/20 2300   Antimicrobials:  None Fluid: None Consultants: None Family Communication:  left VM for patient's daughter 1/9  Status is: Inpatient  Remains inpatient appropriate because: Ongoing treatment  for Covid related pneumonia  Dispo: The patient is from: Home              Anticipated d/c is to: Home              Anticipated d/c date is: > 3 days              Patient currently is not medically stable to d/c.       Infusions:  . sodium chloride 250 mL (09/10/20 1743)  . remdesivir 100 mg in NS 100 mL 100 mg (09/12/20 0959)    Scheduled Meds: . vitamin C  500 mg Oral BID  . baricitinib  4 mg Oral Daily  . enoxaparin (LOVENOX) injection  40 mg Subcutaneous Q24H  . fluticasone furoate-vilanterol  1 puff Inhalation Daily  . guaiFENesin   600 mg Oral BID  . levothyroxine  112 mcg Oral Daily  . methylPREDNISolone (SOLU-MEDROL) injection  1 mg/kg Intravenous Q12H   Followed by  . [START ON 09/13/2020] predniSONE  50 mg Oral Daily  . pantoprazole  40 mg Oral Daily  . umeclidinium bromide  1 puff Inhalation Daily  . zinc sulfate  220 mg Oral Daily    Antimicrobials: Anti-infectives (From admission, onward)   Start     Dose/Rate Route Frequency Ordered Stop   09/10/20 1800  remdesivir 100 mg in sodium chloride 0.9 % 100 mL IVPB       "Followed by" Linked Group Details   100 mg 200 mL/hr over 30 Minutes Intravenous Daily 09/09/20 2249 09/14/20 0959   09/10/20 1000  remdesivir 100 mg in sodium chloride 0.9 % 100 mL IVPB  Status:  Discontinued       "Followed by" Linked Group Details   100 mg 200 mL/hr over 30 Minutes Intravenous Daily 09/09/20 2247 09/09/20 2248   09/09/20 2300  remdesivir 200 mg in sodium chloride 0.9% 250 mL IVPB  Status:  Discontinued       "Followed by" Linked Group Details   200 mg 580 mL/hr over 30 Minutes Intravenous Once 09/09/20 2247 09/09/20 2248   09/09/20 2300  remdesivir 100 mg in sodium chloride 0.9 % 100 mL IVPB       "Followed by" Linked Group Details   100 mg 200 mL/hr over 30 Minutes Intravenous Every 30 min 09/09/20 2249 09/10/20 0204      PRN meds: sodium chloride, acetaminophen, albuterol, ondansetron **OR** ondansetron (ZOFRAN) IV   Objective: Vitals:   09/12/20 0734 09/12/20 1421  BP:  115/71  Pulse:  62  Resp:  18  Temp:  97.7 F (36.5 C)  SpO2: 93% 94%    Intake/Output Summary (Last 24 hours) at 09/12/2020 2032 Last data filed at 09/12/2020 1700 Gross per 24 hour  Intake 960 ml  Output --  Net 960 ml   Filed Weights   09/09/20 1544  Weight: 72.6 kg   Weight change:  Body mass index is 31.25 kg/m.   Physical Exam: General exam: Pleasant.  Not in physical distress Skin: No rashes, lesions or ulcers. HEENT: Atraumatic, normocephalic, no obvious bleeding Lungs:  Clear to auscultation bilaterally CVS: Regular rate and rhythm, no murmur GI/Abd soft, nontender, nondistended, bowel sound present CNS: Alert, awake, oriented x3 Psychiatry: Mood appropriate Extremities: No pedal edema, no calf tenderness  Data Review: I have personally reviewed the laboratory data and studies available.  Recent Labs  Lab 09/09/20 2004 09/10/20 0506 09/11/20 0500 09/12/20 0959  WBC 4.8 3.3* 6.1 4.9  NEUTROABS  3.9 2.7 4.9 4.1  HGB 12.1 12.4 12.4 12.3  HCT 36.7 38.0 38.7 38.3  MCV 96.6 96.9 97.2 97.7  PLT 162 153 221 227   Recent Labs  Lab 09/09/20 2004 09/10/20 0506 09/11/20 0500 09/12/20 0959  NA 133* 139 137 141  K 4.3 4.1 3.8 3.7  CL 100 104 105 109  CO2 23 24 23 23   GLUCOSE 116* 160* 137* 137*  BUN 20 18 32* 33*  CREATININE 0.87 0.66 0.79 0.75  CALCIUM 8.7* 8.8* 9.0 9.0  MG  --   --   --  2.4  PHOS  --   --   --  3.8    F/u labs ordered  Signed, , MD [facility] 09/12/2020

## 2020-09-13 DIAGNOSIS — J9601 Acute respiratory failure with hypoxia: Secondary | ICD-10-CM | POA: Diagnosis not present

## 2020-09-13 DIAGNOSIS — U071 COVID-19: Secondary | ICD-10-CM | POA: Diagnosis not present

## 2020-09-13 LAB — CBC WITH DIFFERENTIAL/PLATELET
Abs Immature Granulocytes: 0.08 10*3/uL — ABNORMAL HIGH (ref 0.00–0.07)
Basophils Absolute: 0 10*3/uL (ref 0.0–0.1)
Basophils Relative: 0 %
Eosinophils Absolute: 0 10*3/uL (ref 0.0–0.5)
Eosinophils Relative: 0 %
HCT: 35.1 % — ABNORMAL LOW (ref 36.0–46.0)
Hemoglobin: 11.4 g/dL — ABNORMAL LOW (ref 12.0–15.0)
Immature Granulocytes: 1 %
Lymphocytes Relative: 13 %
Lymphs Abs: 0.9 10*3/uL (ref 0.7–4.0)
MCH: 32 pg (ref 26.0–34.0)
MCHC: 32.5 g/dL (ref 30.0–36.0)
MCV: 98.6 fL (ref 80.0–100.0)
Monocytes Absolute: 0.6 10*3/uL (ref 0.1–1.0)
Monocytes Relative: 8 %
Neutro Abs: 5.3 10*3/uL (ref 1.7–7.7)
Neutrophils Relative %: 78 %
Platelets: 248 10*3/uL (ref 150–400)
RBC: 3.56 MIL/uL — ABNORMAL LOW (ref 3.87–5.11)
RDW: 13.2 % (ref 11.5–15.5)
WBC: 6.8 10*3/uL (ref 4.0–10.5)
nRBC: 0 % (ref 0.0–0.2)

## 2020-09-13 LAB — COMPREHENSIVE METABOLIC PANEL
ALT: 43 U/L (ref 0–44)
AST: 22 U/L (ref 15–41)
Albumin: 2.9 g/dL — ABNORMAL LOW (ref 3.5–5.0)
Alkaline Phosphatase: 45 U/L (ref 38–126)
Anion gap: 9 (ref 5–15)
BUN: 29 mg/dL — ABNORMAL HIGH (ref 8–23)
CO2: 23 mmol/L (ref 22–32)
Calcium: 8.7 mg/dL — ABNORMAL LOW (ref 8.9–10.3)
Chloride: 107 mmol/L (ref 98–111)
Creatinine, Ser: 0.73 mg/dL (ref 0.44–1.00)
GFR, Estimated: >60 mL/min (ref >60)
Glucose, Bld: 89 mg/dL (ref 70–99)
Potassium: 3.9 mmol/L (ref 3.5–5.1)
Sodium: 139 mmol/L (ref 135–145)
Total Bilirubin: 0.6 mg/dL (ref 0.3–1.2)
Total Protein: 6.1 g/dL — ABNORMAL LOW (ref 6.5–8.1)

## 2020-09-13 LAB — D-DIMER, QUANTITATIVE: D-Dimer, Quant: 0.61 ug/mL-FEU — ABNORMAL HIGH (ref 0.00–0.50)

## 2020-09-13 LAB — C-REACTIVE PROTEIN: CRP: 1.2 mg/dL — ABNORMAL HIGH (ref <1.0)

## 2020-09-13 NOTE — Progress Notes (Signed)
PROGRESS NOTE  Sarah Choi  DOB: [birth year]  PCP: [facility] ZOX:[id number]  DOA: 09/09/2020  LOS: 4 days   Chief Complaint  Patient presents with  . Hypoxia    Brief narrative: Sarah Choi is a 65 y.o. female with PMH significant for hypothyroidism, COPD, unvaccinated to Covid. Patient presented to the ED on 1/6 with complaint of shortness of breath and cough that began 10 days ago.   In the ED, oxygen saturation noted to be 80% on room air, required 4 L by nasal cannula to maintain more than 90%. Covid PCR positive.  Procalcitonin negative, CRP elevated Chest x-ray showed scattered interstitial infiltrates throughout both lungs Patient was admitted for respiratory failure secondary to Covid related pneumonia.  Subjective: Overnight, she was placed on nonrebreather mask in addition to 15 L nasal cannula.  She did not report any shortness of breath overnight.  This may have been possibly added due to low oxygen saturations.  She says she overall feels well  Assessment/Plan: COVID pneumonia Acute respiratory failure with hypoxia  -Presented with progressively worsening cough, shortness of breath -COVID test: PCR positive on admission -Chest imaging: Multifocal pneumonia  -Treatment: Currently on a 5-day course of IV remdesivir till 1/10, Solu-Medrol IV twice daily.  Since 1/7, patient was also started on baricitinib. -Progression: Currently on 12L HFNC.  Inflammatory markers gradually improving. -Continue to monitor -Oxygen - SpO2: 90 % O2 Flow Rate (L/min): 27 L/min -Supportive care: Vitamin C, Zinc, PRN inhalers, Tylenol, Antitussives (benzonatate/ Mucinex/Tussionex).   -Encouraged incentive spirometry, prone position, out of bed and early mobilization as much as possible -Continue airborne/contact isolation precautions for duration of 3 weeks from the day of diagnosis. -WBC and inflammatory markers trend as below.  Recent Labs  Lab 09/09/20 1600  09/09/20 2004 09/09/20 2315 09/10/20 0506 09/11/20 0500 09/12/20 0959 09/13/20 0600  SARSCOV2NAA POSITIVE*  --   --   --   --   --   --   WBC  --  4.8  --  3.3* 6.1 4.9 6.8  PROCALCITON  --  0.11 0.10  --   --   --   --   DDIMER  --  0.76*  --  0.70* 0.58* 0.62* 0.61*  FERRITIN  --  323*  --   --   --   --   --   CRP  --  7.8*  --  9.2* 5.1* 2.4* 1.2*  ALT  --  22  --  18 21 25  43   The treatment plan and use of medications and known side effects were discussed with patient/family. Some of the medications used are based on case reports/anecdotal data.  All other medications being used in the management of COVID-19 based on limited study data.  Complete risks and long-term side effects are unknown, however in the best clinical judgment they seem to be of some benefit.  Patient wanted to proceed with treatment options provided.  Hypothyroidism -Continue Synthroid  COPD -On steroids and bronchodilators as stated above.  Mobility: Encourage ambulation once oxygenation improves Code Status:   Code Status: Full Code  Nutritional status: Body mass index is 31.25 kg/m.     Diet Order            Diet Heart Room service appropriate? Yes; Fluid consistency: Thin  Diet effective now                 DVT prophylaxis: enoxaparin (LOVENOX) injection 40 mg Start: 09/09/20 2300  Antimicrobials:  None Fluid: None Consultants: None Family Communication:  left VM for patient's daughter 1/9  Status is: Inpatient  Remains inpatient appropriate because: Ongoing treatment for Covid related pneumonia  Dispo: The patient is from: Home              Anticipated d/c is to: Home              Anticipated d/c date is: > 3 days              Patient currently is not medically stable to d/c.       Infusions:  . sodium chloride 250 mL (09/10/20 1743)    Scheduled Meds: . vitamin C  500 mg Oral BID  . baricitinib  4 mg Oral Daily  . enoxaparin (LOVENOX) injection  40 mg Subcutaneous  Q24H  . fluticasone furoate-vilanterol  1 puff Inhalation Daily  . guaiFENesin  600 mg Oral BID  . levothyroxine  112 mcg Oral Daily  . pantoprazole  40 mg Oral Daily  . predniSONE  50 mg Oral Daily  . umeclidinium bromide  1 puff Inhalation Daily  . zinc sulfate  220 mg Oral Daily    Antimicrobials: Anti-infectives (From admission, onward)   Start     Dose/Rate Route Frequency Ordered Stop   09/10/20 1800  remdesivir 100 mg in sodium chloride 0.9 % 100 mL IVPB       "Followed by" Linked Group Details   100 mg 200 mL/hr over 30 Minutes Intravenous Daily 09/09/20 2249 09/13/20 1010   09/10/20 1000  remdesivir 100 mg in sodium chloride 0.9 % 100 mL IVPB  Status:  Discontinued       "Followed by" Linked Group Details   100 mg 200 mL/hr over 30 Minutes Intravenous Daily 09/09/20 2247 09/09/20 2248   09/09/20 2300  remdesivir 200 mg in sodium chloride 0.9% 250 mL IVPB  Status:  Discontinued       "Followed by" Linked Group Details   200 mg 580 mL/hr over 30 Minutes Intravenous Once 09/09/20 2247 09/09/20 2248   09/09/20 2300  remdesivir 100 mg in sodium chloride 0.9 % 100 mL IVPB       "Followed by" Linked Group Details   100 mg 200 mL/hr over 30 Minutes Intravenous Every 30 min 09/09/20 2249 09/10/20 0204      PRN meds: sodium chloride, acetaminophen, albuterol, ondansetron **OR** ondansetron (ZOFRAN) IV   Objective: Vitals:   09/13/20 1450 09/13/20 2105  BP:  122/68  Pulse:  (!) 57  Resp:    Temp:  97.8 F (36.6 C)  SpO2: 93% 90%    Intake/Output Summary (Last 24 hours) at 09/13/2020 2126 Last data filed at 09/13/2020 1200 Gross per 24 hour  Intake 480 ml  Output --  Net 480 ml   Filed Weights   09/09/20 1544  Weight: 72.6 kg   Weight change:  Body mass index is 31.25 kg/m.   Physical Exam: General exam: Alert, awake, oriented x 3 Respiratory system: Clear to auscultation. Respiratory effort normal. Cardiovascular system:RRR. No murmurs, rubs,  gallops. Gastrointestinal system: Abdomen is nondistended, soft and nontender. No organomegaly or masses felt. Normal bowel sounds heard. Central nervous system: Alert and oriented. No focal neurological deficits. Extremities: No C/C/E, +pedal pulses Skin: No rashes, lesions or ulcers Psychiatry: Judgement and insight appear normal. Mood & affect appropriate.    Data Review: I have personally reviewed the laboratory data and studies available.  Recent Labs  Lab 09/09/20  2004 09/10/20 0506 09/11/20 0500 09/12/20 0959 09/13/20 0600  WBC 4.8 3.3* 6.1 4.9 6.8  NEUTROABS 3.9 2.7 4.9 4.1 5.3  HGB 12.1 12.4 12.4 12.3 11.4*  HCT 36.7 38.0 38.7 38.3 35.1*  MCV 96.6 96.9 97.2 97.7 98.6  PLT 162 153 221 227 248   Recent Labs  Lab 09/09/20 2004 09/10/20 0506 09/11/20 0500 09/12/20 0959 09/13/20 0600  NA 133* 139 137 141 139  K 4.3 4.1 3.8 3.7 3.9  CL 100 104 105 109 107  CO2 23 24 23 23 23   GLUCOSE 116* 160* 137* 137* 89  BUN 20 18 32* 33* 29*  CREATININE 0.87 0.66 0.79 0.75 0.73  CALCIUM 8.7* 8.8* 9.0 9.0 8.7*  MG  --   --   --  2.4  --   PHOS  --   --   --  3.8  --     F/u labs ordered  Signed, , MD [facility] 09/13/2020

## 2020-09-14 DIAGNOSIS — J9601 Acute respiratory failure with hypoxia: Secondary | ICD-10-CM | POA: Diagnosis not present

## 2020-09-14 DIAGNOSIS — U071 COVID-19: Secondary | ICD-10-CM | POA: Diagnosis not present

## 2020-09-14 LAB — CBC WITH DIFFERENTIAL/PLATELET
Abs Immature Granulocytes: 0.19 10*3/uL — ABNORMAL HIGH (ref 0.00–0.07)
Basophils Absolute: 0 10*3/uL (ref 0.0–0.1)
Basophils Relative: 0 %
Eosinophils Absolute: 0 10*3/uL (ref 0.0–0.5)
Eosinophils Relative: 0 %
HCT: 36.4 % (ref 36.0–46.0)
Hemoglobin: 11.9 g/dL — ABNORMAL LOW (ref 12.0–15.0)
Immature Granulocytes: 2 %
Lymphocytes Relative: 18 %
Lymphs Abs: 1.6 10*3/uL (ref 0.7–4.0)
MCH: 32.1 pg (ref 26.0–34.0)
MCHC: 32.7 g/dL (ref 30.0–36.0)
MCV: 98.1 fL (ref 80.0–100.0)
Monocytes Absolute: 0.5 10*3/uL (ref 0.1–1.0)
Monocytes Relative: 6 %
Neutro Abs: 6.6 10*3/uL (ref 1.7–7.7)
Neutrophils Relative %: 74 %
Platelets: 260 10*3/uL (ref 150–400)
RBC: 3.71 MIL/uL — ABNORMAL LOW (ref 3.87–5.11)
RDW: 13.2 % (ref 11.5–15.5)
WBC: 8.9 10*3/uL (ref 4.0–10.5)
nRBC: 0 % (ref 0.0–0.2)

## 2020-09-14 LAB — COMPREHENSIVE METABOLIC PANEL
ALT: 39 U/L (ref 0–44)
AST: 16 U/L (ref 15–41)
Albumin: 3 g/dL — ABNORMAL LOW (ref 3.5–5.0)
Alkaline Phosphatase: 53 U/L (ref 38–126)
Anion gap: 8 (ref 5–15)
BUN: 28 mg/dL — ABNORMAL HIGH (ref 8–23)
CO2: 23 mmol/L (ref 22–32)
Calcium: 8.5 mg/dL — ABNORMAL LOW (ref 8.9–10.3)
Chloride: 107 mmol/L (ref 98–111)
Creatinine, Ser: 0.7 mg/dL (ref 0.44–1.00)
GFR, Estimated: >60 mL/min (ref >60)
Glucose, Bld: 89 mg/dL (ref 70–99)
Potassium: 3.6 mmol/L (ref 3.5–5.1)
Sodium: 138 mmol/L (ref 135–145)
Total Bilirubin: 0.8 mg/dL (ref 0.3–1.2)
Total Protein: 6.3 g/dL — ABNORMAL LOW (ref 6.5–8.1)

## 2020-09-14 LAB — D-DIMER, QUANTITATIVE: D-Dimer, Quant: 0.54 ug/mL-FEU — ABNORMAL HIGH (ref 0.00–0.50)

## 2020-09-14 LAB — C-REACTIVE PROTEIN: CRP: 1.3 mg/dL — ABNORMAL HIGH (ref <1.0)

## 2020-09-14 NOTE — Progress Notes (Signed)
PROGRESS NOTE  Sarah Choi  DOB: [birth year]  PCP: [facility] [id number]  DOA: 09/09/2020  LOS: 5 days   Chief Complaint  Patient presents with  . Hypoxia    Brief narrative: Sarah Choi is a 65 y.o. female with PMH significant for hypothyroidism, COPD, unvaccinated to Covid. Patient presented to the ED on 1/6 with complaint of shortness of breath and cough that began 10 days ago.   In the ED, oxygen saturation noted to be 80% on room air, required 4 L by nasal cannula to maintain more than 90%. Covid PCR positive.  Procalcitonin negative, CRP elevated Chest x-ray showed scattered interstitial infiltrates throughout both lungs Patient was admitted for respiratory failure secondary to Covid related pneumonia.  Subjective: She continues to improve.  Feels her shortness of breath is better.  She is using her incentive spirometer and pulling about 1500 cc/breath.  Assessment/Plan: COVID pneumonia Acute respiratory failure with hypoxia  -Presented with progressively worsening cough, shortness of breath -COVID test: PCR positive on admission -Chest imaging: Multifocal pneumonia  -Treatment: She completed 5 days of remdesivir on 1/10, currently on Solu-Medrol IV twice daily.  Since 1/7, patient was also started on baricitinib. -Progression: Currently on 15L HFNC.  Inflammatory markers gradually improving. -Continue to monitor -Oxygen - SpO2: 91 % O2 Flow Rate (L/min): 15 L/min -Supportive care: Vitamin C, Zinc, PRN inhalers, Tylenol, Antitussives (benzonatate/ Mucinex/Tussionex).   -Encouraged incentive spirometry, prone position, out of bed and early mobilization as much as possible -Continue airborne/contact isolation precautions for duration of 3 weeks from the day of diagnosis. -WBC and inflammatory markers trend as below.  Recent Labs  Lab 09/09/20 1600 09/09/20 2004 09/09/20 2004 09/09/20 2315 09/10/20 0506 09/11/20 0500 09/12/20 0959 09/13/20 0600  09/14/20 0614  SARSCOV2NAA POSITIVE*  --   --   --   --   --   --   --   --   WBC  --  4.8   < >  --  3.3* 6.1 4.9 6.8 8.9  PROCALCITON  --  0.11  --  0.10  --   --   --   --   --   DDIMER  --  0.76*   < >  --  0.70* 0.58* 0.62* 0.61* 0.54*  FERRITIN  --  323*  --   --   --   --   --   --   --   CRP  --  7.8*   < >  --  9.2* 5.1* 2.4* 1.2* 1.3*  ALT  --  22   < >  --  18 21 25  43 39   < > = values in this interval not displayed.   The treatment plan and use of medications and known side effects were discussed with patient/family. Some of the medications used are based on case reports/anecdotal data.  All other medications being used in the management of COVID-19 based on limited study data.  Complete risks and long-term side effects are unknown, however in the best clinical judgment they seem to be of some benefit.  Patient wanted to proceed with treatment options provided.  Hypothyroidism -Continue Synthroid  COPD -On steroids and bronchodilators as stated above.  Mobility: Encourage ambulation once oxygenation improves Code Status:   Code Status: Full Code  Nutritional status: Body mass index is 31.25 kg/m.     Diet Order            Diet Heart Room service appropriate? Yes; Fluid  consistency: Thin  Diet effective now                 DVT prophylaxis: enoxaparin (LOVENOX) injection 40 mg Start: 09/09/20 2300   Antimicrobials:  None Fluid: None Consultants: None Family Communication:  discussed with patient, left VM for patient's daughter 1/11  Status is: Inpatient  Remains inpatient appropriate because: Ongoing treatment for Covid related pneumonia  Dispo: The patient is from: Home              Anticipated d/c is to: Home, physical therapy evaluation requested              Anticipated d/c date is: > 3 days              Patient currently is not medically stable to d/c.       Infusions:  . sodium chloride 250 mL (09/10/20 1743)    Scheduled Meds: . vitamin C   500 mg Oral BID  . baricitinib  4 mg Oral Daily  . enoxaparin (LOVENOX) injection  40 mg Subcutaneous Q24H  . fluticasone furoate-vilanterol  1 puff Inhalation Daily  . guaiFENesin  600 mg Oral BID  . levothyroxine  112 mcg Oral Daily  . pantoprazole  40 mg Oral Daily  . predniSONE  50 mg Oral Daily  . umeclidinium bromide  1 puff Inhalation Daily  . zinc sulfate  220 mg Oral Daily    Antimicrobials: Anti-infectives (From admission, onward)   Start     Dose/Rate Route Frequency Ordered Stop   09/10/20 1800  remdesivir 100 mg in sodium chloride 0.9 % 100 mL IVPB       "Followed by" Linked Group Details   100 mg 200 mL/hr over 30 Minutes Intravenous Daily 09/09/20 2249 09/13/20 1010   09/10/20 1000  remdesivir 100 mg in sodium chloride 0.9 % 100 mL IVPB  Status:  Discontinued       "Followed by" Linked Group Details   100 mg 200 mL/hr over 30 Minutes Intravenous Daily 09/09/20 2247 09/09/20 2248   09/09/20 2300  remdesivir 200 mg in sodium chloride 0.9% 250 mL IVPB  Status:  Discontinued       "Followed by" Linked Group Details   200 mg 580 mL/hr over 30 Minutes Intravenous Once 09/09/20 2247 09/09/20 2248   09/09/20 2300  remdesivir 100 mg in sodium chloride 0.9 % 100 mL IVPB       "Followed by" Linked Group Details   100 mg 200 mL/hr over 30 Minutes Intravenous Every 30 min 09/09/20 2249 09/10/20 0204      PRN meds: sodium chloride, acetaminophen, albuterol, ondansetron **OR** ondansetron (ZOFRAN) IV   Objective: Vitals:   09/14/20 0741 09/14/20 1300  BP:    Pulse:  79  Resp:  20  Temp:  98.3 F (36.8 C)  SpO2: 91%     Intake/Output Summary (Last 24 hours) at 09/14/2020 2012 Last data filed at 09/14/2020 1700 Gross per 24 hour  Intake 960 ml  Output --  Net 960 ml   Filed Weights   09/09/20 1544  Weight: 72.6 kg   Weight change:  Body mass index is 31.25 kg/m.   Physical Exam: General exam: Alert, awake, oriented x 3 Respiratory system: Clear to  auscultation. Respiratory effort normal. Cardiovascular system:RRR. No murmurs, rubs, gallops. Gastrointestinal system: Abdomen is nondistended, soft and nontender. No organomegaly or masses felt. Normal bowel sounds heard. Central nervous system: Alert and oriented. No focal neurological deficits. Extremities: No  C/C/E, +pedal pulses Skin: No rashes, lesions or ulcers Psychiatry: Judgement and insight appear normal. Mood & affect appropriate.     Data Review: I have personally reviewed the laboratory data and studies available.  Recent Labs  Lab 09/10/20 0506 09/11/20 0500 09/12/20 0959 09/13/20 0600 09/14/20 0614  WBC 3.3* 6.1 4.9 6.8 8.9  NEUTROABS 2.7 4.9 4.1 5.3 6.6  HGB 12.4 12.4 12.3 11.4* 11.9*  HCT 38.0 38.7 38.3 35.1* 36.4  MCV 96.9 97.2 97.7 98.6 98.1  PLT 153 221 227 248 260   Recent Labs  Lab 09/10/20 0506 09/11/20 0500 09/12/20 0959 09/13/20 0600 09/14/20 0614  NA 139 137 141 139 138  K 4.1 3.8 3.7 3.9 3.6  CL 104 105 109 107 107  CO2 24 23 23 23 23   GLUCOSE 160* 137* 137* 89 89  BUN 18 32* 33* 29* 28*  CREATININE 0.66 0.79 0.75 0.73 0.70  CALCIUM 8.8* 9.0 9.0 8.7* 8.5*  MG  --   --  2.4  --   --   PHOS  --   --  3.8  --   --     F/u labs ordered  Signed, , MD [facility] 09/14/2020

## 2020-09-15 DIAGNOSIS — J9601 Acute respiratory failure with hypoxia: Secondary | ICD-10-CM | POA: Diagnosis not present

## 2020-09-15 DIAGNOSIS — U071 COVID-19: Secondary | ICD-10-CM | POA: Diagnosis not present

## 2020-09-15 MED ORDER — SALINE SPRAY 0.65 % NA SOLN
2.0000 | NASAL | Status: DC
  Administered 2020-09-15 – 2020-09-24 (×43): 2 via NASAL
  Filled 2020-09-15 (×3): qty 44

## 2020-09-15 MED ORDER — METHYLPREDNISOLONE SODIUM SUCC 125 MG IJ SOLR
70.0000 mg | Freq: Two times a day (BID) | INTRAMUSCULAR | Status: DC
  Administered 2020-09-15 – 2020-09-24 (×17): 70 mg via INTRAVENOUS
  Filled 2020-09-15 (×18): qty 2

## 2020-09-15 MED ORDER — SODIUM CHLORIDE 0.9 % IV BOLUS
500.0000 mL | Freq: Once | INTRAVENOUS | Status: AC
  Administered 2020-09-15: 500 mL via INTRAVENOUS

## 2020-09-15 NOTE — Evaluation (Signed)
Physical Therapy Evaluation Patient Details Name: Sarah Choi MRN: [id number] DOB: 22-Oct-1955 Today's Date: 09/15/2020   History of Present Illness  Sarah Choi is a 65 y.o. female with medical history significant of COPD, hypothyroidism.     Pt unvaccinated to COVID.     Pt presents to ED with c/o cough, SOB, fever, chills for past 1 week.  Not normally on O2 at home.  No known sick contacts.  Symptoms are constant, severe, worsening.  Nothing makes better or worse.     No abd pain, no N/V/D.    Clinical Impression  Patient functioning near baseline for functional mobility and gait, demonstrates good return for bed mobility and ambulation in room without loss of balance.  Patient encouraged to ambulate ad lib as tolerated for length of hospital stay and when return home.  Plan:  Patient discharged from physical therapy to care of nursing for ambulation daily as tolerated for length of stay.     Follow Up Recommendations No PT follow up;Supervision - Intermittent    Equipment Recommendations  None recommended by PT    Recommendations for Other Services       Precautions / Restrictions Precautions Precautions: None Restrictions Weight Bearing Restrictions: No      Mobility  Bed Mobility Overal bed mobility: Modified Independent                  Transfers Overall transfer level: Modified independent                  Ambulation/Gait Ambulation/Gait assistance: Modified independent (Device/Increase time) Gait Distance (Feet): 50 Feet Assistive device: None Gait Pattern/deviations: WFL(Within Functional Limits) Gait velocity: decreased   General Gait Details: grossly WFL, slightly labored cadence with good return for ambulation in room without loss of balance  Stairs            Wheelchair Mobility    Modified Rankin (Stroke Patients Only)       Balance Overall balance assessment: No apparent balance deficits (not formally assessed)                                            Pertinent Vitals/Pain Pain Assessment: No/denies pain    Home Living Family/patient expects to be discharged to:: Private residence Living Arrangements: Spouse/significant other (boyfriend) Available Help at Discharge: Family;Available 24 hours/day Type of Home: Mobile home Home Access: Stairs to enter Entrance Stairs-Rails: Right;Left;Can reach both Entrance Stairs-Number of Steps: 5 Home Layout: One level Home Equipment: None      Prior Function Level of Independence: Independent         Comments: community ambulator, drives     Higher education careers adviser        Extremity/Trunk Assessment   Upper Extremity Assessment Upper Extremity Assessment: Overall WFL for tasks assessed    Lower Extremity Assessment Lower Extremity Assessment: Overall WFL for tasks assessed    Cervical / Trunk Assessment Cervical / Trunk Assessment: Normal  Communication   Communication: No difficulties  Cognition Arousal/Alertness: Awake/alert Behavior During Therapy: WFL for tasks assessed/performed Overall Cognitive Status: Within Functional Limits for tasks assessed                                        General Comments      Exercises  Assessment/Plan    PT Assessment Patent does not need any further PT services  PT Problem List         PT Treatment Interventions      PT Goals (Current goals can be found in the Care Plan section)  Acute Rehab PT Goals Patient Stated Goal: return home with family to assist PT Goal Formulation: With patient Time For Goal Achievement: 09/15/20 Potential to Achieve Goals: Good    Frequency     Barriers to discharge        Co-evaluation               AM-PAC PT "6 Clicks" Mobility  Outcome Measure Help needed turning from your back to your side while in a flat bed without using bedrails?: None Help needed moving from lying on your back to sitting on the side of a  flat bed without using bedrails?: None Help needed moving to and from a bed to a chair (including a wheelchair)?: None Help needed standing up from a chair using your arms (e.g., wheelchair or bedside chair)?: None Help needed to walk in hospital room?: None Help needed climbing 3-5 steps with a railing? : None 6 Click Score: 24    End of Session Equipment Utilized During Treatment: Oxygen Activity Tolerance: Patient tolerated treatment well;Patient limited by fatigue Patient left: in chair;with call bell/phone within reach Nurse Communication: Mobility status PT Visit Diagnosis: Unsteadiness on feet (R26.81);Other abnormalities of gait and mobility (R26.89);Muscle weakness (generalized) (M62.81)    Time: 3785-8850 PT Time Calculation (min) (ACUTE ONLY): 20 min   Charges:   PT Evaluation $PT Eval Moderate Complexity: 1 Mod PT Treatments $Therapeutic Activity: 8-22 mins        4:57 PM, 09/15/20 Ocie Bob, MPT Physical Therapist with [facility] [phone] office [phone] mobile phone

## 2020-09-15 NOTE — Progress Notes (Signed)
PROGRESS NOTE  Sarah Choi YYQ:[id number] DOB: 10-21-1955 DOA: 09/09/2020 PCP: [facility]  Brief History:  Sarah Choi is a 65 y.o. female with PMH significant for hypothyroidism, COPD, unvaccinated to Covid. Patient presented to the ED on 1/6 with complaint of shortness of breath and cough that began 10 days ago.   In the ED, oxygen saturation noted to be 80% on room air, required 4 L by nasal cannula to maintain more than 90%. Covid PCR positive.  Procalcitonin negative, CRP elevated Chest x-ray showed scattered interstitial infiltrates throughout both lungs Patient was admitted for respiratory failure secondary to Covid related pneumonia.  She was started on remdesivir, IV steroids and baricitinib.  Assessment/Plan: COVID pneumonia Acute respiratory failure with hypoxia  -Presented with progressively worsening cough, shortness of breath -COVID test: PCR positive on admission -Chest imaging: Multifocal pneumonia  -Treatment: She completed 5 days of remdesivir on 1/10, - currently on Solu-Medrol IV twice daily--restart 1/12   -Since 1/7, patient was also started on baricitinib. -Progression: Currently on 15L HFNC>>10 -inflammatory markers slowly improving -Continue to monitor -Oxygen - SpO2: 85-90% on 10L O2 Flow Rate (L/min): 10 L/min -Supportive care: Vitamin C, Zinc, PRN inhalers, Tylenol, Antitussives (benzonatate/ Mucinex/Tussionex).   -Encouraged incentive spirometry, prone position, out of bed and early mobilization as much as possible -Continue airborne/contact isolation precautions for duration of 3 weeks from the day of diagnosis. -WBC and inflammatory markers trend as below.  Hypothyroidism -Continue Synthroid  COPD -On steroids and bronchodilators as stated above. -approx 30 pack years   Status is: Inpatient  Remains inpatient appropriate because:IV treatments appropriate due to intensity of illness or inability to take  PO   Dispo: The patient is from: Home              Anticipated d/c is to: Home              Anticipated d/c date is: 2 days              Patient currently is not medically stable to d/c.        Family Communication:  no Family at bedside  Consultants:  none  Code Status:  FULL   DVT Prophylaxis:  [place] Lovenox   Procedures: As Listed in Progress Note Above  Antibiotics: None      Subjective: Patient denies fevers, chills, headache, chest pain, dyspnea, nausea, vomiting, diarrhea, abdominal pain, dysuria, hematuria, hematochezia, and melena.   Objective: Vitals:   09/14/20 2127 09/15/20 0710 09/15/20 0749 09/15/20 1332  BP: 122/73  94/65 (!) 87/53  Pulse: 75  72 82  Resp: 20  20 19   Temp: 98.3 F (36.8 C)  98.1 F (36.7 C) 98.2 F (36.8 C)  TempSrc: Oral  Oral Oral  SpO2: (!) 84% (!) 88% (!) 87% (!) 87%  Weight:      Height:        Intake/Output Summary (Last 24 hours) at 09/15/2020 1711 Last data filed at 09/15/2020 11/13/2020 Gross per 24 hour  Intake 240 ml  Output 500 ml  Net -260 ml   Weight change:  Exam:   General:  Pt is alert, follows commands appropriately, not in acute distress  HEENT: No icterus, No thrush, No neck mass, [place]/AT  Cardiovascular: RRR, S1/S2, no rubs, no gallops  Respiratory: diminished BS bilateral.  Bilateral rales. No wheeze  Abdomen: Soft/+BS, non tender, non distended, no guarding  Extremities: trace LE edema, No lymphangitis,  No petechiae, No rashes, no synovitis   Data Reviewed: I have personally reviewed following labs and imaging studies Basic Metabolic Panel: Recent Labs  Lab 09/10/20 0506 09/11/20 0500 09/12/20 0959 09/13/20 0600 09/14/20 0614  NA 139 137 141 139 138  K 4.1 3.8 3.7 3.9 3.6  CL 104 105 109 107 107  CO2 24 23 23 23 23   GLUCOSE 160* 137* 137* 89 89  BUN 18 32* 33* 29* 28*  CREATININE 0.66 0.79 0.75 0.73 0.70  CALCIUM 8.8* 9.0 9.0 8.7* 8.5*  MG  --   --  2.4  --   --   PHOS  --   --   3.8  --   --    Liver Function Tests: Recent Labs  Lab 09/10/20 0506 09/11/20 0500 09/12/20 0959 09/13/20 0600 09/14/20 0614  AST 17 15 17 22 16   ALT 18 21 25  43 39  ALKPHOS 51 49 48 45 53  BILITOT 0.5 0.5 0.5 0.6 0.8  PROT 7.1 6.8 6.6 6.1* 6.3*  ALBUMIN 3.3* 3.1* 3.1* 2.9* 3.0*   No results for input(s): LIPASE, AMYLASE in the last 168 hours. No results for input(s): AMMONIA in the last 168 hours. Coagulation Profile: No results for input(s): INR, PROTIME in the last 168 hours. CBC: Recent Labs  Lab 09/10/20 0506 09/11/20 0500 09/12/20 0959 09/13/20 0600 09/14/20 0614  WBC 3.3* 6.1 4.9 6.8 8.9  NEUTROABS 2.7 4.9 4.1 5.3 6.6  HGB 12.4 12.4 12.3 11.4* 11.9*  HCT 38.0 38.7 38.3 35.1* 36.4  MCV 96.9 97.2 97.7 98.6 98.1  PLT 153 221 227 248 260   Cardiac Enzymes: No results for input(s): CKTOTAL, CKMB, CKMBINDEX, TROPONINI in the last 168 hours. BNP: Invalid input(s): POCBNP CBG: No results for input(s): GLUCAP in the last 168 hours. HbA1C: No results for input(s): HGBA1C in the last 72 hours. Urine analysis: No results found for: COLORURINE, APPEARANCEUR, LABSPEC, PHURINE, GLUCOSEU, HGBUR, BILIRUBINUR, KETONESUR, PROTEINUR, UROBILINOGEN, NITRITE, LEUKOCYTESUR Sepsis Labs: @LABRCNTIP (procalcitonin:4,lacticidven:4) ) Recent Results (from the past 240 hour(s))  Resp Panel by RT-PCR (Flu A&B, Covid) Nasopharyngeal Swab     Status: Abnormal   Collection Time: 09/09/20  4:00 PM   Specimen: Nasopharyngeal Swab; Nasopharyngeal(NP) swabs in vial transport medium  Result Value Ref Range Status   SARS Coronavirus 2 by RT PCR POSITIVE (A) NEGATIVE Final    Comment: RESULT CALLED TO, READ BACK BY AND VERIFIED WITH:  LINDA LONG @1745  09/09/20 BY JONES,T (NOTE) SARS-CoV-2 target nucleic acids are DETECTED.  The SARS-CoV-2 RNA is generally detectable in upper respiratory specimens during the acute phase of infection. Positive results are indicative of the presence of the  identified virus, but do not rule out bacterial infection or co-infection with other pathogens not detected by the test. Clinical correlation with patient history and other diagnostic information is necessary to determine patient infection status. The expected result is Negative.  Fact Sheet for Patients: 11/12/20  Fact Sheet for Healthcare Providers:  This test is not yet approved or cleared by the 11/07/20 FDA and  has been authorized for detection and/or diagnosis of SARS-CoV-2 by FDA under an Emergency Use Authorization (EUA).  This EUA will remain in effect (meaning this test can  be used) for the duration of  the COVID-19 declaration under Section 564(b)(1) of the Act, 21 U.S.C. section 360bbb-3(b)(1), unless the authorization is terminated or revoked sooner.     Influenza A by PCR NEGATIVE NEGATIVE Final   Influenza B by PCR NEGATIVE  NEGATIVE Final    Comment: (NOTE) The Xpert Xpress SARS-CoV-2/FLU/RSV plus assay is intended as an aid in the diagnosis of influenza from Nasopharyngeal swab specimens and should not be used as a sole basis for treatment. Nasal washings and aspirates are unacceptable for Xpert Xpress SARS-CoV-2/FLU/RSV testing.  Fact Sheet for Patients: BloggerCourse.com  Fact Sheet for Healthcare Providers: SeriousBroker.it  This test is not yet approved or cleared by the [place] FDA and has been authorized for detection and/or diagnosis of SARS-CoV-2 by FDA under an Emergency Use Authorization (EUA). This EUA will remain in effect (meaning this test can be used) for the duration of the COVID-19 declaration under Section 564(b)(1) of the Act, 21 U.S.C. section 360bbb-3(b)(1), unless the authorization is terminated or revoked.  Performed at [facility], [address], [place], [place] [address]      Scheduled  Meds: . vitamin C  500 mg Oral BID  . baricitinib  4 mg Oral Daily  . enoxaparin (LOVENOX) injection  40 mg Subcutaneous Q24H  . fluticasone furoate-vilanterol  1 puff Inhalation Daily  . guaiFENesin  600 mg Oral BID  . levothyroxine  112 mcg Oral Daily  . methylPREDNISolone (SOLU-MEDROL) injection  70 mg Intravenous Q12H  . pantoprazole  40 mg Oral Daily  . sodium chloride  2 spray Each Nare Q4H while awake  . umeclidinium bromide  1 puff Inhalation Daily  . zinc sulfate  220 mg Oral Daily   Continuous Infusions: . sodium chloride 250 mL (09/10/20 1743)    Procedures/Studies: DG Chest Port 1 View  Result Date: 09/09/2020 CLINICAL DATA:  Shortness of breath and cough beginning 10 days ago, history of COPD, former smoker EXAM: PORTABLE CHEST 1 VIEW COMPARISON:  Portable exam 1628 hours without priors available for comparison FINDINGS: Normal heart size, mediastinal contours, and pulmonary vascularity. Scattered infiltrates throughout both lungs, could represent pulmonary edema or atypical infection. Minimal central peribronchial thickening. No pleural effusion or pneumothorax. Osseous structures unremarkable. IMPRESSION: Scattered interstitial infiltrates throughout both lungs, question pulmonary edema versus atypical infection. Electronically Signed   By: Ulyses Southward M.D.   On: 09/09/2020 16:37    Catarina Hartshorn, DO  [facility]  If 7PM-7AM, please contact night-coverage [email or web address] Password [email or web address] 09/15/2020, 5:11 PM   LOS: 6 days

## 2020-09-16 ENCOUNTER — Inpatient Hospital Stay (HOSPITAL_COMMUNITY): Payer: BC Managed Care – PPO

## 2020-09-16 ENCOUNTER — Encounter (HOSPITAL_COMMUNITY): Payer: Self-pay | Admitting: Radiology

## 2020-09-16 DIAGNOSIS — J9601 Acute respiratory failure with hypoxia: Secondary | ICD-10-CM | POA: Diagnosis not present

## 2020-09-16 DIAGNOSIS — U071 COVID-19: Secondary | ICD-10-CM | POA: Diagnosis not present

## 2020-09-16 LAB — COMPREHENSIVE METABOLIC PANEL
ALT: 27 U/L (ref 0–44)
AST: 13 U/L — ABNORMAL LOW (ref 15–41)
Albumin: 2.8 g/dL — ABNORMAL LOW (ref 3.5–5.0)
Alkaline Phosphatase: 54 U/L (ref 38–126)
Anion gap: 8 (ref 5–15)
BUN: 20 mg/dL (ref 8–23)
CO2: 24 mmol/L (ref 22–32)
Calcium: 8.6 mg/dL — ABNORMAL LOW (ref 8.9–10.3)
Chloride: 106 mmol/L (ref 98–111)
Creatinine, Ser: 0.64 mg/dL (ref 0.44–1.00)
GFR, Estimated: >60 mL/min (ref >60)
Glucose, Bld: 135 mg/dL — ABNORMAL HIGH (ref 70–99)
Potassium: 4.2 mmol/L (ref 3.5–5.1)
Sodium: 138 mmol/L (ref 135–145)
Total Bilirubin: 0.8 mg/dL (ref 0.3–1.2)
Total Protein: 6 g/dL — ABNORMAL LOW (ref 6.5–8.1)

## 2020-09-16 LAB — CBC
HCT: 34.7 % — ABNORMAL LOW (ref 36.0–46.0)
Hemoglobin: 11.2 g/dL — ABNORMAL LOW (ref 12.0–15.0)
MCH: 31.5 pg (ref 26.0–34.0)
MCHC: 32.3 g/dL (ref 30.0–36.0)
MCV: 97.7 fL (ref 80.0–100.0)
Platelets: 302 10*3/uL (ref 150–400)
RBC: 3.55 MIL/uL — ABNORMAL LOW (ref 3.87–5.11)
RDW: 13.2 % (ref 11.5–15.5)
WBC: 7.8 10*3/uL (ref 4.0–10.5)
nRBC: 0 % (ref 0.0–0.2)

## 2020-09-16 LAB — D-DIMER, QUANTITATIVE: D-Dimer, Quant: 0.52 ug/mL-FEU — ABNORMAL HIGH (ref 0.00–0.50)

## 2020-09-16 LAB — C-REACTIVE PROTEIN: CRP: 3.6 mg/dL — ABNORMAL HIGH (ref <1.0)

## 2020-09-16 LAB — MAGNESIUM: Magnesium: 2.4 mg/dL (ref 1.7–2.4)

## 2020-09-16 MED ORDER — IOHEXOL 350 MG/ML SOLN
75.0000 mL | Freq: Once | INTRAVENOUS | Status: AC | PRN
  Administered 2020-09-16: 75 mL via INTRAVENOUS

## 2020-09-16 NOTE — Progress Notes (Signed)
PROGRESS NOTE  Sarah Choi IOE:[id number] DOB: [birth year] DOA: 09/09/2020 PCP: [facility]  Brief History:  Sarah Choi a 65 y.o.femalewith PMH significant for hypothyroidism, COPD, unvaccinated to Covid. Patient presented to the ED on 1/6 with complaint of shortness of breath and cough that began 10 days ago.  In the ED, oxygen saturation noted to be 80% on room air, required 4 L by nasal cannula to maintain more than 90%. Covid PCR positive. Procalcitonin negative, CRP elevated Chest x-ray showed scattered interstitial infiltrates throughout both lungs Patient was admitted for respiratory failure secondary to Covid related pneumonia.  She was started on remdesivir, IV steroids and baricitinib.  Assessment/Plan: COVID pneumonia Acute respiratory failure with hypoxia  -Presented with progressively worsening cough, shortness of breath -COVID test: PCR positive on admission -Chest xray: Multifocal pneumonia  -Treatment:She completed 5 days of remdesivir on 1/10, -currently onSolu-Medrol IV twice daily--restart 1/12  -Since 1/7, patient was also started on baricitinib. -Progression: Currently on 15L HFNC>>10 -inflammatory markers slowly improving overall -Continue to monitor -Oxygen -SpO2: 85-90% on 10L (better reading on ear) O2 Flow Rate (L/min): 10 L/min -Supportive care: Vitamin C, Zinc, PRN inhalers, Tylenol, Antitussives (benzonatate/ Mucinex/Tussionex).  -Encouraged incentive spirometry, prone position, out of bed and early mobilization as much as possible -Continue airborne/contact isolation precautions for duration of 3 weeks from the day of diagnosis. -WBC and inflammatory markers trend as below. -obtain CTA chest  Hypothyroidism -Continue Synthroid  COPD -On steroids and bronchodilators as stated above. -approx 30 pack years   Status is: Inpatient  Remains inpatient appropriate because:IV treatments appropriate due to  intensity of illness or inability to take PO   Dispo: The patient is from: Home  Anticipated d/c is to: Home  Anticipated d/c date is: 2 days  Patient currently is not medically stable to d/c.        Family Communication:  no Family at bedside  Consultants:  none  Code Status:  FULL   DVT Prophylaxis:   Lovenox   Procedures: As Listed in Progress Note Above  Antibiotics: None       Subjective: Patient denies fevers, chills, headache, chest pain, dyspnea, nausea, vomiting, diarrhea, abdominal pain, dysuria, hematuria, hematochezia, and melena.   Objective: Vitals:   09/16/20 0711 09/16/20 0842 09/16/20 1200 09/16/20 1337  BP:  102/62  (!) 98/59  Pulse:  75 79 64  Resp:  18 18 18   Temp:  97.7 F (36.5 C)  97.8 F (36.6 C)  TempSrc:  Oral    SpO2: 94% (!) 89% 90% 91%  Weight:      Height:        Intake/Output Summary (Last 24 hours) at 09/16/2020 1643 Last data filed at 09/16/2020 1436 Gross per 24 hour  Intake 720 ml  Output --  Net 720 ml   Weight change:  Exam:   General:  Pt is alert, follows commands appropriately, not in acute distress  HEENT: No icterus, No thrush, No neck mass, [place]/AT  Cardiovascular: RRR, S1/S2, no rubs, no gallops  Respiratory: diminished BS.  Bilateral rales  Abdomen: Soft/+BS, non tender, non distended, no guarding  Extremities: No edema, No lymphangitis, No petechiae, No rashes, no synovitis   Data Reviewed: I have personally reviewed following labs and imaging studies Basic Metabolic Panel: Recent Labs  Lab 09/11/20 0500 09/12/20 0959 09/13/20 0600 09/14/20 0614 09/16/20 0618  NA 137 141 139 138 138  K 3.8 3.7 3.9 3.6  4.2  CL 105 109 107 107 106  CO2 23 23 23 23 24   GLUCOSE 137* 137* 89 89 135*  BUN 32* 33* 29* 28* 20  CREATININE 0.79 0.75 0.73 0.70 0.64  CALCIUM 9.0 9.0 8.7* 8.5* 8.6*  MG  --  2.4  --   --  2.4  PHOS  --  3.8  --   --   --     Liver Function Tests: Recent Labs  Lab 09/11/20 0500 09/12/20 0959 09/13/20 0600 09/14/20 0614 09/16/20 0618  AST 15 17 22 16  13*  ALT 21 25 43 39 27  ALKPHOS 49 48 45 53 54  BILITOT 0.5 0.5 0.6 0.8 0.8  PROT 6.8 6.6 6.1* 6.3* 6.0*  ALBUMIN 3.1* 3.1* 2.9* 3.0* 2.8*   No results for input(s): LIPASE, AMYLASE in the last 168 hours. No results for input(s): AMMONIA in the last 168 hours. Coagulation Profile: No results for input(s): INR, PROTIME in the last 168 hours. CBC: Recent Labs  Lab 09/10/20 0506 09/11/20 0500 09/12/20 0959 09/13/20 0600 09/14/20 0614 09/16/20 0618  WBC 3.3* 6.1 4.9 6.8 8.9 7.8  NEUTROABS 2.7 4.9 4.1 5.3 6.6  --   HGB 12.4 12.4 12.3 11.4* 11.9* 11.2*  HCT 38.0 38.7 38.3 35.1* 36.4 34.7*  MCV 96.9 97.2 97.7 98.6 98.1 97.7  PLT 153 221 227 248 260 302   Cardiac Enzymes: No results for input(s): CKTOTAL, CKMB, CKMBINDEX, TROPONINI in the last 168 hours. BNP: Invalid input(s): POCBNP CBG: No results for input(s): GLUCAP in the last 168 hours. HbA1C: No results for input(s): HGBA1C in the last 72 hours. Urine analysis: No results found for: COLORURINE, APPEARANCEUR, LABSPEC, PHURINE, GLUCOSEU, HGBUR, BILIRUBINUR, KETONESUR, PROTEINUR, UROBILINOGEN, NITRITE, LEUKOCYTESUR Sepsis Labs: @LABRCNTIP (procalcitonin:4,lacticidven:4) ) Recent Results (from the past 240 hour(s))  Resp Panel by RT-PCR (Flu A&B, Covid) Nasopharyngeal Swab     Status: Abnormal   Collection Time: 09/09/20  4:00 PM   Specimen: Nasopharyngeal Swab; Nasopharyngeal(NP) swabs in vial transport medium  Result Value Ref Range Status   SARS Coronavirus 2 by RT PCR POSITIVE (A) NEGATIVE Final    Comment: RESULT CALLED TO, READ BACK BY AND VERIFIED WITH:  LINDA LONG @1745  09/09/20 BY JONES,T (NOTE) SARS-CoV-2 target nucleic acids are DETECTED.  The SARS-CoV-2 RNA is generally detectable in upper respiratory specimens during the acute phase of infection. Positive results  are indicative of the presence of the identified virus, but do not rule out bacterial infection or co-infection with other pathogens not detected by the test. Clinical correlation with patient history and other diagnostic information is necessary to determine patient infection status. The expected result is Negative.  Fact Sheet for Patients:  Fact Sheet for Healthcare Providers: 11/07/20  This test is not yet approved or cleared by the FDA and  has been authorized for detection and/or diagnosis of SARS-CoV-2 by FDA under an Emergency Use Authorization (EUA).  This EUA will remain in effect (meaning this test can  be used) for the duration of  the COVID-19 declaration under Section 564(b)(1) of the Act, 21 U.S.C. section 360bbb-3(b)(1), unless the authorization is terminated or revoked sooner.     Influenza A by PCR NEGATIVE NEGATIVE Final   Influenza B by PCR NEGATIVE NEGATIVE Final    Comment: (NOTE) The Xpert Xpress SARS-CoV-2/FLU/RSV plus assay is intended as an aid in the diagnosis of influenza from Nasopharyngeal swab specimens and should not be used as a sole basis for treatment. Nasal washings and  aspirates are unacceptable for Xpert Xpress SARS-CoV-2/FLU/RSV testing.  Fact Sheet for Patients: BloggerCourse.com  Fact Sheet for Healthcare Providers: SeriousBroker.it  This test is not yet approved or cleared by the [place] FDA and has been authorized for detection and/or diagnosis of SARS-CoV-2 by FDA under an Emergency Use Authorization (EUA). This EUA will remain in effect (meaning this test can be used) for the duration of the COVID-19 declaration under Section 564(b)(1) of the Act, 21 U.S.C. section 360bbb-3(b)(1), unless the authorization is terminated or revoked.  Performed at [facility], [address],  [place], [place] [address]      Scheduled Meds: . vitamin C  500 mg Oral BID  . baricitinib  4 mg Oral Daily  . enoxaparin (LOVENOX) injection  40 mg Subcutaneous Q24H  . fluticasone furoate-vilanterol  1 puff Inhalation Daily  . guaiFENesin  600 mg Oral BID  . levothyroxine  112 mcg Oral Daily  . methylPREDNISolone (SOLU-MEDROL) injection  70 mg Intravenous Q12H  . pantoprazole  40 mg Oral Daily  . sodium chloride  2 spray Each Nare Q4H while awake  . umeclidinium bromide  1 puff Inhalation Daily  . zinc sulfate  220 mg Oral Daily   Continuous Infusions: . sodium chloride 250 mL (09/10/20 1743)    Procedures/Studies: DG Chest Port 1 View  Result Date: 09/09/2020 CLINICAL DATA:  Shortness of breath and cough beginning 10 days ago, history of COPD, former smoker EXAM: PORTABLE CHEST 1 VIEW COMPARISON:  Portable exam 1628 hours without priors available for comparison FINDINGS: Normal heart size, mediastinal contours, and pulmonary vascularity. Scattered infiltrates throughout both lungs, could represent pulmonary edema or atypical infection. Minimal central peribronchial thickening. No pleural effusion or pneumothorax. Osseous structures unremarkable. IMPRESSION: Scattered interstitial infiltrates throughout both lungs, question pulmonary edema versus atypical infection. Electronically Signed   By: Ulyses Southward M.D.   On: 09/09/2020 16:37    Catarina Hartshorn, DO  [facility]  If 7PM-7AM, please contact night-coverage [email or web address] Password [facility] 09/16/2020, 4:43 PM   LOS: 7 days

## 2020-09-16 NOTE — Progress Notes (Signed)
Pt up in recliner on first rounds this am, Denies c/o, states breathing, "much better". Pt using incentive spirometer, taking good deep breaths. Continues with congested cough but states coughing up less phlegm than yesterday, clear to yellow in color. SaO2 with ear probe reads 89%, finger probe reads 85-87% on 11 lpm HFNC. Breath sounds clear but diminished bilaterally.

## 2020-09-17 DIAGNOSIS — U071 COVID-19: Secondary | ICD-10-CM | POA: Diagnosis not present

## 2020-09-17 DIAGNOSIS — J9601 Acute respiratory failure with hypoxia: Secondary | ICD-10-CM | POA: Diagnosis not present

## 2020-09-17 LAB — COMPREHENSIVE METABOLIC PANEL
ALT: 25 U/L (ref 0–44)
AST: 12 U/L — ABNORMAL LOW (ref 15–41)
Albumin: 2.9 g/dL — ABNORMAL LOW (ref 3.5–5.0)
Alkaline Phosphatase: 61 U/L (ref 38–126)
Anion gap: 9 (ref 5–15)
BUN: 21 mg/dL (ref 8–23)
CO2: 22 mmol/L (ref 22–32)
Calcium: 8.7 mg/dL — ABNORMAL LOW (ref 8.9–10.3)
Chloride: 107 mmol/L (ref 98–111)
Creatinine, Ser: 0.64 mg/dL (ref 0.44–1.00)
GFR, Estimated: >60 mL/min (ref >60)
Glucose, Bld: 161 mg/dL — ABNORMAL HIGH (ref 70–99)
Potassium: 3.8 mmol/L (ref 3.5–5.1)
Sodium: 138 mmol/L (ref 135–145)
Total Bilirubin: 0.7 mg/dL (ref 0.3–1.2)
Total Protein: 6.2 g/dL — ABNORMAL LOW (ref 6.5–8.1)

## 2020-09-17 LAB — CBC
HCT: 35.7 % — ABNORMAL LOW (ref 36.0–46.0)
Hemoglobin: 11.6 g/dL — ABNORMAL LOW (ref 12.0–15.0)
MCH: 32 pg (ref 26.0–34.0)
MCHC: 32.5 g/dL (ref 30.0–36.0)
MCV: 98.3 fL (ref 80.0–100.0)
Platelets: 382 10*3/uL (ref 150–400)
RBC: 3.63 MIL/uL — ABNORMAL LOW (ref 3.87–5.11)
RDW: 13.5 % (ref 11.5–15.5)
WBC: 12.2 10*3/uL — ABNORMAL HIGH (ref 4.0–10.5)
nRBC: 0 % (ref 0.0–0.2)

## 2020-09-17 LAB — BRAIN NATRIURETIC PEPTIDE: B Natriuretic Peptide: 57 pg/mL (ref 0.0–100.0)

## 2020-09-17 LAB — C-REACTIVE PROTEIN: CRP: 1.7 mg/dL — ABNORMAL HIGH (ref <1.0)

## 2020-09-17 LAB — PROCALCITONIN: Procalcitonin: <0.1 ng/mL

## 2020-09-17 LAB — D-DIMER, QUANTITATIVE: D-Dimer, Quant: 0.58 ug/mL-FEU — ABNORMAL HIGH (ref 0.00–0.50)

## 2020-09-17 MED ORDER — WITCH HAZEL-GLYCERIN EX PADS: MEDICATED_PAD | CUTANEOUS | Status: DC | PRN

## 2020-09-17 NOTE — Plan of Care (Signed)

## 2020-09-17 NOTE — Progress Notes (Signed)
PROGRESS NOTE  Sarah Choi GHW:[id number] DOB: Feb 03, 1956 DOA: 09/09/2020 PCP: [facility]  Brief History: Sarah Choi a 65 y.o.femalewith PMH significant for hypothyroidism, COPD, unvaccinated to Covid. Patient presented to the ED on 1/6 with complaint of shortness of breath and cough that began 10 days ago.  In the ED, oxygen saturation noted to be 80% on room air, required 4 L by nasal cannula to maintain more than 90%. Covid PCR positive. Procalcitonin negative, CRP elevated Chest x-ray showed scattered interstitial infiltrates throughout both lungs Patient was admitted for respiratory failure secondary to Covid related pneumonia.She was started on remdesivir, IV steroids and baricitinib.  Assessment/Plan: COVID pneumonia Acute respiratory failure with hypoxia  -Presented with progressively worsening cough, shortness of breath -COVID test: PCR positive on admission -Chest xray: Multifocal pneumonia -PCT <0.10 -Treatment:She completed 5 days of remdesivir on 1/10, -currently onSolu-Medrol IV twice daily--restart 1/12  -Since 1/7, patient was also started on baricitinib. -Progression: Currently on 15L HFNC>>10 -inflammatory markers slowly improving overall -Continue to monitor -Oxygen -SpO2:85-90% on 10L (better reading on ear) O2 Flow Rate (L/min):10L/min -Supportive care: Vitamin C, Zinc, PRN inhalers, Tylenol, Antitussives (benzonatate/ Mucinex/Tussionex).  -Encouraged incentive spirometry, prone position, out of bed and early mobilization as much as possible -Continue airborne/contact isolation precautions for duration of 3 weeks from the day of diagnosis. -WBC and inflammatory markers trend as below. -09/16/20--CTA chest--neg for PE; Diffuse peripheral predominant ground-glass opacities throughout both lungs typical of COVID pneumonia  Hypothyroidism -Continue Synthroid  COPD -On steroids and bronchodilators as stated  above. -approx 30 pack years   Status is: Inpatient  Remains inpatient appropriate because:IV treatments appropriate due to intensity of illness or inability to take PO   Dispo: The patient is from:Home Anticipated d/c is VE:LFYB Anticipated d/c date is: 2 days Patient currently is not medically stable to d/c.        Family Communication:noFamily at bedside  Consultants:none  Code Status: FULL   DVT Prophylaxis: [place] Lovenox   Procedures: As Listed in Progress Note Above  Antibiotics: None     Subjective: Patient feels sob with minimal exertion.  Denies f/c cp, n/v/d, abd pain,  Objective: Vitals:   09/16/20 2124 09/17/20 0541 09/17/20 0823 09/17/20 1500  BP: 103/63 112/70  112/70  Pulse: 76 (!) 55  73  Resp: 19 18    Temp: 98.2 F (36.8 C) 97.8 F (36.6 C)  97.9 F (36.6 C)  TempSrc:  Oral  Oral  SpO2: 92% 94% 92% (!) 85%  Weight:      Height:        Intake/Output Summary (Last 24 hours) at 09/17/2020 1738 Last data filed at 09/16/2020 2124 Gross per 24 hour  Intake 240 ml  Output --  Net 240 ml   Weight change:  Exam:   General:  Pt is alert, follows commands appropriately, not in acute distress  HEENT: No icterus, No thrush, No neck mass, [place]/AT  Cardiovascular: RRR, S1/S2, no rubs, no gallops  Respiratory: bilateral rales . No wheeze  Abdomen: Soft/+BS, non tender, non distended, no guarding  Extremities: No edema, No lymphangitis, No petechiae, No rashes, no synovitis   Data Reviewed: I have personally reviewed following labs and imaging studies Basic Metabolic Panel: Recent Labs  Lab 09/12/20 0959 09/13/20 0600 09/14/20 0614 09/16/20 0618 09/17/20 0420  NA 141 139 138 138 138  K 3.7 3.9 3.6 4.2 3.8  CL 109 107 107 106 107  CO2 23 23 23 24 22   GLUCOSE 137* 89 89 135* 161*  BUN 33* 29* 28* 20 21  CREATININE 0.75 0.73 0.70 0.64 0.64  CALCIUM 9.0 8.7* 8.5*  8.6* 8.7*  MG 2.4  --   --  2.4  --   PHOS 3.8  --   --   --   --    Liver Function Tests: Recent Labs  Lab 09/12/20 0959 09/13/20 0600 09/14/20 0614 09/16/20 0618 09/17/20 0420  AST 17 22 16  13* 12*  ALT 25 43 39 27 25  ALKPHOS 48 45 53 54 61  BILITOT 0.5 0.6 0.8 0.8 0.7  PROT 6.6 6.1* 6.3* 6.0* 6.2*  ALBUMIN 3.1* 2.9* 3.0* 2.8* 2.9*   No results for input(s): LIPASE, AMYLASE in the last 168 hours. No results for input(s): AMMONIA in the last 168 hours. Coagulation Profile: No results for input(s): INR, PROTIME in the last 168 hours. CBC: Recent Labs  Lab 09/11/20 0500 09/12/20 0959 09/13/20 0600 09/14/20 0614 09/16/20 0618 09/17/20 0420  WBC 6.1 4.9 6.8 8.9 7.8 12.2*  NEUTROABS 4.9 4.1 5.3 6.6  --   --   HGB 12.4 12.3 11.4* 11.9* 11.2* 11.6*  HCT 38.7 38.3 35.1* 36.4 34.7* 35.7*  MCV 97.2 97.7 98.6 98.1 97.7 98.3  PLT 221 227 248 260 302 382   Cardiac Enzymes: No results for input(s): CKTOTAL, CKMB, CKMBINDEX, TROPONINI in the last 168 hours. BNP: Invalid input(s): POCBNP CBG: No results for input(s): GLUCAP in the last 168 hours. HbA1C: No results for input(s): HGBA1C in the last 72 hours. Urine analysis: No results found for: COLORURINE, APPEARANCEUR, LABSPEC, PHURINE, GLUCOSEU, HGBUR, BILIRUBINUR, KETONESUR, PROTEINUR, UROBILINOGEN, NITRITE, LEUKOCYTESUR Sepsis Labs: @LABRCNTIP (procalcitonin:4,lacticidven:4) ) Recent Results (from the past 240 hour(s))  Resp Panel by RT-PCR (Flu A&B, Covid) Nasopharyngeal Swab     Status: Abnormal   Collection Time: 09/09/20  4:00 PM   Specimen: Nasopharyngeal Swab; Nasopharyngeal(NP) swabs in vial transport medium  Result Value Ref Range Status   SARS Coronavirus 2 by RT PCR POSITIVE (A) NEGATIVE Final    Comment: RESULT CALLED TO, READ BACK BY AND VERIFIED WITH:  LINDA LONG @1745  09/09/20 BY JONES,T (NOTE) SARS-CoV-2 target nucleic acids are DETECTED.  The SARS-CoV-2 RNA is generally detectable in upper  respiratory specimens during the acute phase of infection. Positive results are indicative of the presence of the identified virus, but do not rule out bacterial infection or co-infection with other pathogens not detected by the test. Clinical correlation with patient history and other diagnostic information is necessary to determine patient infection status. The expected result is Negative.  Fact Sheet for Patients:  Fact Sheet for Healthcare Providers: 11/07/20  This test is not yet approved or cleared by the FDA and  has been authorized for detection and/or diagnosis of SARS-CoV-2 by FDA under an Emergency Use Authorization (EUA).  This EUA will remain in effect (meaning this test can  be used) for the duration of  the COVID-19 declaration under Section 564(b)(1) of the Act, 21 U.S.C. section 360bbb-3(b)(1), unless the authorization is terminated or revoked sooner.     Influenza A by PCR NEGATIVE NEGATIVE Final   Influenza B by PCR NEGATIVE NEGATIVE Final    Comment: (NOTE) The Xpert Xpress SARS-CoV-2/FLU/RSV plus assay is intended as an aid in the diagnosis of influenza from Nasopharyngeal swab specimens and should not be used as a sole basis for treatment. Nasal washings and aspirates are unacceptable for Xpert Xpress SARS-CoV-2/FLU/RSV  testing.  Fact Sheet for Patients: BloggerCourse.com  Fact Sheet for Healthcare Providers: SeriousBroker.it  This test is not yet approved or cleared by the [place] FDA and has been authorized for detection and/or diagnosis of SARS-CoV-2 by FDA under an Emergency Use Authorization (EUA). This EUA will remain in effect (meaning this test can be used) for the duration of the COVID-19 declaration under Section 564(b)(1) of the Act, 21 U.S.C. section 360bbb-3(b)(1), unless the authorization is  terminated or revoked.  Performed at [facility], [address] [place], [place] [address]      Scheduled Meds: . vitamin C  500 mg Oral BID  . baricitinib  4 mg Oral Daily  . enoxaparin (LOVENOX) injection  40 mg Subcutaneous Q24H  . fluticasone furoate-vilanterol  1 puff Inhalation Daily  . guaiFENesin  600 mg Oral BID  . levothyroxine  112 mcg Oral Daily  . methylPREDNISolone (SOLU-MEDROL) injection  70 mg Intravenous Q12H  . pantoprazole  40 mg Oral Daily  . sodium chloride  2 spray Each Nare Q4H while awake  . umeclidinium bromide  1 puff Inhalation Daily  . zinc sulfate  220 mg Oral Daily   Continuous Infusions: . sodium chloride 250 mL (09/10/20 1743)    Procedures/Studies: CT ANGIO CHEST PE W OR WO CONTRAST  Result Date: 09/16/2020 CLINICAL DATA:  PE suspected, low/intermediate prob, positive D-dimer COVID 19, sob, hypoxia EXAM: CT ANGIOGRAPHY CHEST WITH CONTRAST TECHNIQUE: Multidetector CT imaging of the chest was performed using the standard protocol during bolus administration of intravenous contrast. Multiplanar CT image reconstructions and MIPs were obtained to evaluate the vascular anatomy. CONTRAST:  2mL OMNIPAQUE IOHEXOL 350 MG/ML SOLN COMPARISON:  Radiograph 09/09/2020 FINDINGS: Cardiovascular: There are no filling defects within the pulmonary arteries to suggest pulmonary embolus. Mild aortic atherosclerosis without dissection or acute aortic findings. No aortic aneurysm. Common origin of the brachiocephalic and left common carotid artery, left vertebral artery arises directly from the thoracic aorta, variant arch anatomy. Heart is normal in size. No pericardial effusion. Mediastinum/Nodes: Enlarged mediastinal or hilar lymph nodes. No axillary adenopathy. Punctate right thyroid calcification needs no further follow-up. Tiny hiatal hernia. No esophageal wall thickening. No pneumomediastinum. Lungs/Pleura: Diffuse peripheral predominant ground-glass opacities throughout  both lungs typical of COVID pneumonia. There is scattered central bronchial thickening. Mild apical predominant emphysema. No pneumothorax or pleural fluid. Upper Abdomen: No acute or unexpected findings. Musculoskeletal: There are no acute or suspicious osseous abnormalities. Review of the MIP images confirms the above findings. IMPRESSION: 1. No pulmonary embolus. 2. Diffuse peripheral predominant ground-glass opacities throughout both lungs typical of COVID pneumonia. Aortic Atherosclerosis (ICD10-I70.0) and Emphysema (ICD10-J43.9). Electronically Signed   By: Narda Rutherford M.D.   On: 09/16/2020 20:11   DG Chest Port 1 View  Result Date: 09/09/2020 CLINICAL DATA:  Shortness of breath and cough beginning 10 days ago, history of COPD, former smoker EXAM: PORTABLE CHEST 1 VIEW COMPARISON:  Portable exam 1628 hours without priors available for comparison FINDINGS: Normal heart size, mediastinal contours, and pulmonary vascularity. Scattered infiltrates throughout both lungs, could represent pulmonary edema or atypical infection. Minimal central peribronchial thickening. No pleural effusion or pneumothorax. Osseous structures unremarkable. IMPRESSION: Scattered interstitial infiltrates throughout both lungs, question pulmonary edema versus atypical infection. Electronically Signed   By: Ulyses Southward M.D.   On: 09/09/2020 16:37    Catarina Hartshorn, DO  [facility]  If 7PM-7AM, please contact night-coverage [email or web address] Password [facility] 09/17/2020, 5:38 PM   LOS: 8 days

## 2020-09-18 DIAGNOSIS — U071 COVID-19: Secondary | ICD-10-CM | POA: Diagnosis not present

## 2020-09-18 DIAGNOSIS — J9601 Acute respiratory failure with hypoxia: Secondary | ICD-10-CM | POA: Diagnosis not present

## 2020-09-18 LAB — COMPREHENSIVE METABOLIC PANEL
ALT: 24 U/L (ref 0–44)
AST: 11 U/L — ABNORMAL LOW (ref 15–41)
Albumin: 3 g/dL — ABNORMAL LOW (ref 3.5–5.0)
Alkaline Phosphatase: 69 U/L (ref 38–126)
Anion gap: 9 (ref 5–15)
BUN: 25 mg/dL — ABNORMAL HIGH (ref 8–23)
CO2: 23 mmol/L (ref 22–32)
Calcium: 8.6 mg/dL — ABNORMAL LOW (ref 8.9–10.3)
Chloride: 105 mmol/L (ref 98–111)
Creatinine, Ser: 0.64 mg/dL (ref 0.44–1.00)
GFR, Estimated: >60 mL/min (ref >60)
Glucose, Bld: 144 mg/dL — ABNORMAL HIGH (ref 70–99)
Potassium: 3.6 mmol/L (ref 3.5–5.1)
Sodium: 137 mmol/L (ref 135–145)
Total Bilirubin: 0.5 mg/dL (ref 0.3–1.2)
Total Protein: 6.4 g/dL — ABNORMAL LOW (ref 6.5–8.1)

## 2020-09-18 LAB — CBC
HCT: 37.5 % (ref 36.0–46.0)
Hemoglobin: 12.1 g/dL (ref 12.0–15.0)
MCH: 32.1 pg (ref 26.0–34.0)
MCHC: 32.3 g/dL (ref 30.0–36.0)
MCV: 99.5 fL (ref 80.0–100.0)
Platelets: 437 10*3/uL — ABNORMAL HIGH (ref 150–400)
RBC: 3.77 MIL/uL — ABNORMAL LOW (ref 3.87–5.11)
RDW: 13.8 % (ref 11.5–15.5)
WBC: 12.5 10*3/uL — ABNORMAL HIGH (ref 4.0–10.5)
nRBC: 0 % (ref 0.0–0.2)

## 2020-09-18 LAB — MAGNESIUM: Magnesium: 2.3 mg/dL (ref 1.7–2.4)

## 2020-09-18 LAB — PHOSPHORUS: Phosphorus: 3.7 mg/dL (ref 2.5–4.6)

## 2020-09-18 LAB — FERRITIN: Ferritin: 467 ng/mL — ABNORMAL HIGH (ref 11–307)

## 2020-09-18 LAB — C-REACTIVE PROTEIN: CRP: 1.3 mg/dL — ABNORMAL HIGH (ref <1.0)

## 2020-09-18 MED ORDER — GUAIFENESIN ER 600 MG PO TB12
1200.0000 mg | ORAL_TABLET | Freq: Two times a day (BID) | ORAL | Status: DC
  Administered 2020-09-18 – 2020-09-24 (×12): 1200 mg via ORAL
  Filled 2020-09-18 (×12): qty 2

## 2020-09-18 NOTE — Progress Notes (Signed)
PROGRESS NOTE  Sarah Choi QJJ:[id number] DOB: [birth year] DOA: 09/09/2020 PCP: [facility]    Brief History: Sarah Choi a 65 y.o.femalewith PMH significant for hypothyroidism, COPD, unvaccinated to Covid. Patient presented to the ED on 1/6 with complaint of shortness of breath and cough that began 10 days ago.  In the ED, oxygen saturation noted to be 80% on room air, required 4 L by nasal cannula to maintain more than 90%. Covid PCR positive. Procalcitonin negative, CRP elevated Chest x-ray showed scattered interstitial infiltrates throughout both lungs Patient was admitted for respiratory failure secondary to Covid related pneumonia.She was started on remdesivir, IV steroids and baricitinib.  Assessment/Plan: COVID pneumonia Acute respiratory failure with hypoxia  -Presented with progressively worsening cough, shortness of breath -COVID test: PCR positive on admission -Chestxray: Multifocal pneumonia -PCT <0.10 -Treatment:She completed 5 days of remdesivir on 1/10, -currently onSolu-Medrol IV twice daily--restart 1/12  -Since 1/7, patient was also started on baricitinib. -Progression: Currently on 15L HFNC>>10 -inflammatory markers slowly improvingoverall -Continue to monitor -Oxygen -SpO2:85-90% on 10L(better reading on ear) O2 Flow Rate (L/min):10L/min -Supportive care: Vitamin C, Zinc, PRN inhalers, Tylenol, Antitussives (benzonatate/ Mucinex/Tussionex).  -Encouraged incentive spirometry, prone position, out of bed and early mobilization as much as possible -Continue airborne/contact isolation precautions for duration of 3 weeks from the day of diagnosis. -WBC and inflammatory markers trend as below. -09/16/20--CTA chest--neg for PE; Diffuse peripheral predominant ground-glass opacities throughout both lungs typical of COVID pneumonia  Hypothyroidism -Continue Synthroid  COPD -On steroids and bronchodilators as stated  above. -approx 30 pack years   Status is: Inpatient  Remains inpatient appropriate because:IV treatments appropriate due to intensity of illness or inability to take PO   Dispo: The patient is from:Home Anticipated d/c is GY:JEHU Anticipated d/c date is: 2 days Patient currently is not medically stable to d/c.        Family Communication:noFamily at bedside  Consultants:none  Code Status: FULL   DVT Prophylaxis: [place] Lovenox   Procedures: As Listed in Progress Note Above  Antibiotics: None     Subjective: Patient denies fevers, chills, headache, chest pain, dyspnea, nausea, vomiting, diarrhea, abdominal pain, dysuria, hematuria, hematochezia, and melena.  She has some dyspnea on exertion   Objective: Vitals:   09/17/20 1600 09/17/20 2141 09/18/20 0726 09/18/20 1305  BP:  99/60  (!) 84/56  Pulse:  61  69  Resp:  19  20  Temp:  98.7 F (37.1 C)  (!) 97.5 F (36.4 C)  TempSrc:  Oral    SpO2: 93% 90% 91% 90%  Weight:      Height:        Intake/Output Summary (Last 24 hours) at 09/18/2020 1621 Last data filed at 09/18/2020 1300 Gross per 24 hour  Intake 240 ml  Output --  Net 240 ml   Weight change:  Exam:   General:  Pt is alert, follows commands appropriately, not in acute distress  HEENT: No icterus, No thrush, No neck mass, [place]/AT  Cardiovascular: RRR, S1/S2, no rubs, no gallops  Respiratory: bibasilar crackles. No wheeze  Abdomen: Soft/+BS, non tender, non distended, no guarding  Extremities: Non pitting LE edema, No lymphangitis, No petechiae, No rashes, no synovitis   Data Reviewed: I have personally reviewed following labs and imaging studies Basic Metabolic Panel: Recent Labs  Lab 09/12/20 0959 09/13/20 0600 09/14/20 0614 09/16/20 0618 09/17/20 0420 09/18/20 0638  NA 141 139 138 138 138 137  K  3.7 3.9 3.6 4.2 3.8 3.6  CL 109 107 107 106 107 105  CO2 23 23  23 24 22 23   GLUCOSE 137* 89 89 135* 161* 144*  BUN 33* 29* 28* 20 21 25*  CREATININE 0.75 0.73 0.70 0.64 0.64 0.64  CALCIUM 9.0 8.7* 8.5* 8.6* 8.7* 8.6*  MG 2.4  --   --  2.4  --  2.3  PHOS 3.8  --   --   --   --  3.7   Liver Function Tests: Recent Labs  Lab 09/13/20 0600 09/14/20 0614 09/16/20 0618 09/17/20 0420 09/18/20 0638  AST 22 16 13* 12* 11*  ALT 43 39 27 25 24   ALKPHOS 45 53 54 61 69  BILITOT 0.6 0.8 0.8 0.7 0.5  PROT 6.1* 6.3* 6.0* 6.2* 6.4*  ALBUMIN 2.9* 3.0* 2.8* 2.9* 3.0*   No results for input(s): LIPASE, AMYLASE in the last 168 hours. No results for input(s): AMMONIA in the last 168 hours. Coagulation Profile: No results for input(s): INR, PROTIME in the last 168 hours. CBC: Recent Labs  Lab 09/12/20 0959 09/13/20 0600 09/14/20 0614 09/16/20 0618 09/17/20 0420 09/18/20 0638  WBC 4.9 6.8 8.9 7.8 12.2* 12.5*  NEUTROABS 4.1 5.3 6.6  --   --   --   HGB 12.3 11.4* 11.9* 11.2* 11.6* 12.1  HCT 38.3 35.1* 36.4 34.7* 35.7* 37.5  MCV 97.7 98.6 98.1 97.7 98.3 99.5  PLT 227 248 260 302 382 437*   Cardiac Enzymes: No results for input(s): CKTOTAL, CKMB, CKMBINDEX, TROPONINI in the last 168 hours. BNP: Invalid input(s): POCBNP CBG: No results for input(s): GLUCAP in the last 168 hours. HbA1C: No results for input(s): HGBA1C in the last 72 hours. Urine analysis: No results found for: COLORURINE, APPEARANCEUR, LABSPEC, PHURINE, GLUCOSEU, HGBUR, BILIRUBINUR, KETONESUR, PROTEINUR, UROBILINOGEN, NITRITE, LEUKOCYTESUR Sepsis Labs: @LABRCNTIP (procalcitonin:4,lacticidven:4) ) Recent Results (from the past 240 hour(s))  Resp Panel by RT-PCR (Flu A&B, Covid) Nasopharyngeal Swab     Status: Abnormal   Collection Time: 09/09/20  4:00 PM   Specimen: Nasopharyngeal Swab; Nasopharyngeal(NP) swabs in vial transport medium  Result Value Ref Range Status   SARS Coronavirus 2 by RT PCR POSITIVE (A) NEGATIVE Final    Comment: RESULT CALLED TO, READ BACK BY AND VERIFIED  WITH:  LINDA LONG @1745  09/09/20 BY JONES,T (NOTE) SARS-CoV-2 target nucleic acids are DETECTED.  The SARS-CoV-2 RNA is generally detectable in upper respiratory specimens during the acute phase of infection. Positive results are indicative of the presence of the identified virus, but do not rule out bacterial infection or co-infection with other pathogens not detected by the test. Clinical correlation with patient history and other diagnostic information is necessary to determine patient infection status. The expected result is Negative.  Fact Sheet for Patients: BloggerCourse.comhttps://www.fda.gov/media/152166/download  Fact Sheet for Healthcare Providers: SeriousBroker.ithttps://www.fda.gov/media/152162/download  This test is not yet approved or cleared by the [place] FDA and  has been authorized for detection and/or diagnosis of SARS-CoV-2 by FDA under an Emergency Use Authorization (EUA).  This EUA will remain in effect (meaning this test can  be used) for the duration of  the COVID-19 declaration under Section 564(b)(1) of the Act, 21 U.S.C. section 360bbb-3(b)(1), unless the authorization is terminated or revoked sooner.     Influenza A by PCR NEGATIVE NEGATIVE Final   Influenza B by PCR NEGATIVE NEGATIVE Final    Comment: (NOTE) The Xpert Xpress SARS-CoV-2/FLU/RSV plus assay is intended as an aid in the diagnosis of influenza from  Nasopharyngeal swab specimens and should not be used as a sole basis for treatment. Nasal washings and aspirates are unacceptable for Xpert Xpress SARS-CoV-2/FLU/RSV testing.  Fact Sheet for Patients: BloggerCourse.com  Fact Sheet for Healthcare Providers: SeriousBroker.it  This test is not yet approved or cleared by the [place] FDA and has been authorized for detection and/or diagnosis of SARS-CoV-2 by FDA under an Emergency Use Authorization (EUA). This EUA will remain in effect (meaning this test can be  used) for the duration of the COVID-19 declaration under Section 564(b)(1) of the Act, 21 U.S.C. section 360bbb-3(b)(1), unless the authorization is terminated or revoked.  Performed at [facility], [address] [place], [place] [address]      Scheduled Meds: . vitamin C  500 mg Oral BID  . baricitinib  4 mg Oral Daily  . enoxaparin (LOVENOX) injection  40 mg Subcutaneous Q24H  . fluticasone furoate-vilanterol  1 puff Inhalation Daily  . guaiFENesin  600 mg Oral BID  . levothyroxine  112 mcg Oral Daily  . methylPREDNISolone (SOLU-MEDROL) injection  70 mg Intravenous Q12H  . pantoprazole  40 mg Oral Daily  . sodium chloride  2 spray Each Nare Q4H while awake  . umeclidinium bromide  1 puff Inhalation Daily  . zinc sulfate  220 mg Oral Daily   Continuous Infusions: . sodium chloride 250 mL (09/10/20 1743)    Procedures/Studies: CT ANGIO CHEST PE W OR WO CONTRAST  Result Date: 09/16/2020 CLINICAL DATA:  PE suspected, low/intermediate prob, positive D-dimer COVID 19, sob, hypoxia EXAM: CT ANGIOGRAPHY CHEST WITH CONTRAST TECHNIQUE: Multidetector CT imaging of the chest was performed using the standard protocol during bolus administration of intravenous contrast. Multiplanar CT image reconstructions and MIPs were obtained to evaluate the vascular anatomy. CONTRAST:  6mL OMNIPAQUE IOHEXOL 350 MG/ML SOLN COMPARISON:  Radiograph 09/09/2020 FINDINGS: Cardiovascular: There are no filling defects within the pulmonary arteries to suggest pulmonary embolus. Mild aortic atherosclerosis without dissection or acute aortic findings. No aortic aneurysm. Common origin of the brachiocephalic and left common carotid artery, left vertebral artery arises directly from the thoracic aorta, variant arch anatomy. Heart is normal in size. No pericardial effusion. Mediastinum/Nodes: Enlarged mediastinal or hilar lymph nodes. No axillary adenopathy. Punctate right thyroid calcification needs no further follow-up.  Tiny hiatal hernia. No esophageal wall thickening. No pneumomediastinum. Lungs/Pleura: Diffuse peripheral predominant ground-glass opacities throughout both lungs typical of COVID pneumonia. There is scattered central bronchial thickening. Mild apical predominant emphysema. No pneumothorax or pleural fluid. Upper Abdomen: No acute or unexpected findings. Musculoskeletal: There are no acute or suspicious osseous abnormalities. Review of the MIP images confirms the above findings. IMPRESSION: 1. No pulmonary embolus. 2. Diffuse peripheral predominant ground-glass opacities throughout both lungs typical of COVID pneumonia. Aortic Atherosclerosis (ICD10-I70.0) and Emphysema (ICD10-J43.9). Electronically Signed   By: Narda Rutherford M.D.   On: 09/16/2020 20:11   DG Chest Port 1 View  Result Date: 09/09/2020 CLINICAL DATA:  Shortness of breath and cough beginning 10 days ago, history of COPD, former smoker EXAM: PORTABLE CHEST 1 VIEW COMPARISON:  Portable exam 1628 hours without priors available for comparison FINDINGS: Normal heart size, mediastinal contours, and pulmonary vascularity. Scattered infiltrates throughout both lungs, could represent pulmonary edema or atypical infection. Minimal central peribronchial thickening. No pleural effusion or pneumothorax. Osseous structures unremarkable. IMPRESSION: Scattered interstitial infiltrates throughout both lungs, question pulmonary edema versus atypical infection. Electronically Signed   By: Ulyses Southward M.D.   On: 09/09/2020 16:37    Catarina Hartshorn,  DO  [facility]  If 7PM-7AM, please contact night-coverage [email or web address] Password [email or web address] 09/18/2020, 4:21 PM   LOS: 9 days

## 2020-09-19 DIAGNOSIS — U071 COVID-19: Secondary | ICD-10-CM | POA: Diagnosis not present

## 2020-09-19 DIAGNOSIS — J9601 Acute respiratory failure with hypoxia: Secondary | ICD-10-CM | POA: Diagnosis not present

## 2020-09-19 MED ORDER — NYSTATIN 100000 UNIT/ML MT SUSP: 5.0000 mL | Freq: Four times a day (QID) | OROMUCOSAL | Status: DC

## 2020-09-19 MED ORDER — NYSTATIN 100000 UNIT/ML MT SUSP
5.0000 mL | Freq: Four times a day (QID) | OROMUCOSAL | Status: DC
  Administered 2020-09-19 – 2020-09-24 (×23): 500000 [IU] via ORAL
  Filled 2020-09-19 (×23): qty 5

## 2020-09-19 NOTE — Progress Notes (Signed)
RN called due to patient complaining of sore throat and developed white patches on the tongue.  This may be due to steroid induced oral thrush (oropharyngeal candidiasis).  She was started on nystatin (swish and swallow)

## 2020-09-19 NOTE — Progress Notes (Signed)
PROGRESS NOTE  Sarah Choi KDX:[id number] DOB: 1956-07-05 DOA: 09/09/2020 PCP: [facility]  Brief History: Sarah Choi a 65 y.o.femalewith PMH significant for hypothyroidism, COPD, unvaccinated to Covid. Patient presented to the ED on 1/6 with complaint of shortness of breath and cough that began 10 days ago.  In the ED, oxygen saturation noted to be 80% on room air, required 4 L by nasal cannula to maintain more than 90%. Covid PCR positive. Procalcitonin negative, CRP elevated Chest x-ray showed scattered interstitial infiltrates throughout both lungs Patient was admitted for respiratory failure secondary to Covid related pneumonia.She was started on remdesivir, IV steroids and baricitinib.  Assessment/Plan: COVID pneumonia Acute respiratory failure with hypoxia  -Presented with progressively worsening cough, shortness of breath -COVID test: PCR positive on admission -Chestxray: Multifocal pneumonia -PCT <0.10 -Treatment:She completed 5 days of remdesivir on 1/10, -currently onSolu-Medrol IV twice daily--restart 1/12  -Since 1/7, patient was also started on baricitinib. -Progression: Currently on 15L HFNC>>10>>6L with saturation 90% -inflammatory markers slowly improvingoverall -Continue to monitor -Oxygen -SpO2:85-90% on 10L(better reading on ear) O2 Flow Rate (L/min):10L/min>>6L -Supportive care: Vitamin C, Zinc, PRN inhalers, Tylenol, Antitussives (benzonatate/ Mucinex/Tussionex).  -Encouraged incentive spirometry, prone position, out of bed and early mobilization as much as possible -Continue airborne/contact isolation precautions for duration of 3 weeks from the day of diagnosis. -WBC and inflammatory markers trend as below. -09/16/20--CTA chest--neg for PE;Diffuse peripheral predominant ground-glass opacities throughout both lungs typical of COVID pneumonia  Hypothyroidism -Continue Synthroid  COPD -On steroids and  bronchodilators as stated above. -approx 30 pack years   Status is: Inpatient  Remains inpatient appropriate because:IV treatments appropriate due to intensity of illness or inability to take PO   Dispo: The patient is from:Home Anticipated d/c is ZJ:QBHA Anticipated d/c date is: 2 days Patient currently is not medically stable to d/c.        Family Communication:noFamily at bedside  Consultants:none  Code Status: FULL   DVT Prophylaxis: [place] Lovenox   Procedures: As Listed in Progress Note Above  Antibiotics: None    Subjective: Patient has sob with minimal exertion, but is slowly improving.  Denies f/c, cp, n/v/d, abd pain.  Objective: Vitals:   09/18/20 2128 09/19/20 0626 09/19/20 0756 09/19/20 1421  BP: 101/65 112/66  (!) 100/56  Pulse: 80 (!) 55  78  Resp:  18  20  Temp: 98.3 F (36.8 C) (!) 97.5 F (36.4 C)  97.9 F (36.6 C)  TempSrc: Oral Oral  Oral  SpO2: (!) 85% 95% 90% 91%  Weight:      Height:        Intake/Output Summary (Last 24 hours) at 09/19/2020 1600 Last data filed at 09/18/2020 1700 Gross per 24 hour  Intake 240 ml  Output --  Net 240 ml   Weight change:  Exam:   General:  Pt is alert, follows commands appropriately, not in acute distress  HEENT: No icterus, No thrush, No neck mass, [place]/AT  Cardiovascular: RRR, S1/S2, no rubs, no gallops  Respiratory: bibasilar rales. No wheeze  Abdomen: Soft/+BS, non tender, non distended, no guarding  Extremities: Nonpitting edema, No lymphangitis, No petechiae, No rashes, no synovitis   Data Reviewed: I have personally reviewed following labs and imaging studies Basic Metabolic Panel: Recent Labs  Lab 09/13/20 0600 09/14/20 0614 09/16/20 0618 09/17/20 0420 09/18/20 0638  NA 139 138 138 138 137  K 3.9 3.6 4.2 3.8 3.6  CL 107 107 106  107 105  CO2 23 23 24 22 23   GLUCOSE 89 89 135* 161* 144*  BUN 29* 28* 20 21  25*  CREATININE 0.73 0.70 0.64 0.64 0.64  CALCIUM 8.7* 8.5* 8.6* 8.7* 8.6*  MG  --   --  2.4  --  2.3  PHOS  --   --   --   --  3.7   Liver Function Tests: Recent Labs  Lab 09/13/20 0600 09/14/20 0614 09/16/20 0618 09/17/20 0420 09/18/20 0638  AST 22 16 13* 12* 11*  ALT 43 39 27 25 24   ALKPHOS 45 53 54 61 69  BILITOT 0.6 0.8 0.8 0.7 0.5  PROT 6.1* 6.3* 6.0* 6.2* 6.4*  ALBUMIN 2.9* 3.0* 2.8* 2.9* 3.0*   No results for input(s): LIPASE, AMYLASE in the last 168 hours. No results for input(s): AMMONIA in the last 168 hours. Coagulation Profile: No results for input(s): INR, PROTIME in the last 168 hours. CBC: Recent Labs  Lab 09/13/20 0600 09/14/20 0614 09/16/20 0618 09/17/20 0420 09/18/20 0638  WBC 6.8 8.9 7.8 12.2* 12.5*  NEUTROABS 5.3 6.6  --   --   --   HGB 11.4* 11.9* 11.2* 11.6* 12.1  HCT 35.1* 36.4 34.7* 35.7* 37.5  MCV 98.6 98.1 97.7 98.3 99.5  PLT 248 260 302 382 437*   Cardiac Enzymes: No results for input(s): CKTOTAL, CKMB, CKMBINDEX, TROPONINI in the last 168 hours. BNP: Invalid input(s): POCBNP CBG: No results for input(s): GLUCAP in the last 168 hours. HbA1C: No results for input(s): HGBA1C in the last 72 hours. Urine analysis: No results found for: COLORURINE, APPEARANCEUR, LABSPEC, PHURINE, GLUCOSEU, HGBUR, BILIRUBINUR, KETONESUR, PROTEINUR, UROBILINOGEN, NITRITE, LEUKOCYTESUR Sepsis Labs: @LABRCNTIP (procalcitonin:4,lacticidven:4) )No results found for this or any previous visit (from the past 240 hour(s)).   Scheduled Meds: . vitamin C  500 mg Oral BID  . baricitinib  4 mg Oral Daily  . enoxaparin (LOVENOX) injection  40 mg Subcutaneous Q24H  . fluticasone furoate-vilanterol  1 puff Inhalation Daily  . guaiFENesin  1,200 mg Oral BID  . levothyroxine  112 mcg Oral Daily  . methylPREDNISolone (SOLU-MEDROL) injection  70 mg Intravenous Q12H  . nystatin  5 mL Oral QID  . pantoprazole  40 mg Oral Daily  . sodium chloride  2 spray Each Nare Q4H  while awake  . umeclidinium bromide  1 puff Inhalation Daily  . zinc sulfate  220 mg Oral Daily   Continuous Infusions: . sodium chloride 250 mL (09/10/20 1743)    Procedures/Studies: CT ANGIO CHEST PE W OR WO CONTRAST  Result Date: 09/16/2020 CLINICAL DATA:  PE suspected, low/intermediate prob, positive D-dimer COVID 19, sob, hypoxia EXAM: CT ANGIOGRAPHY CHEST WITH CONTRAST TECHNIQUE: Multidetector CT imaging of the chest was performed using the standard protocol during bolus administration of intravenous contrast. Multiplanar CT image reconstructions and MIPs were obtained to evaluate the vascular anatomy. CONTRAST:  61mL OMNIPAQUE IOHEXOL 350 MG/ML SOLN COMPARISON:  Radiograph 09/09/2020 FINDINGS: Cardiovascular: There are no filling defects within the pulmonary arteries to suggest pulmonary embolus. Mild aortic atherosclerosis without dissection or acute aortic findings. No aortic aneurysm. Common origin of the brachiocephalic and left common carotid artery, left vertebral artery arises directly from the thoracic aorta, variant arch anatomy. Heart is normal in size. No pericardial effusion. Mediastinum/Nodes: Enlarged mediastinal or hilar lymph nodes. No axillary adenopathy. Punctate right thyroid calcification needs no further follow-up. Tiny hiatal hernia. No esophageal wall thickening. No pneumomediastinum. Lungs/Pleura: Diffuse peripheral predominant ground-glass opacities throughout both lungs typical  of COVID pneumonia. There is scattered central bronchial thickening. Mild apical predominant emphysema. No pneumothorax or pleural fluid. Upper Abdomen: No acute or unexpected findings. Musculoskeletal: There are no acute or suspicious osseous abnormalities. Review of the MIP images confirms the above findings. IMPRESSION: 1. No pulmonary embolus. 2. Diffuse peripheral predominant ground-glass opacities throughout both lungs typical of COVID pneumonia. Aortic Atherosclerosis (ICD10-I70.0) and  Emphysema (ICD10-J43.9). Electronically Signed   By: Narda Rutherford M.D.   On: 09/16/2020 20:11   DG Chest Port 1 View  Result Date: 09/09/2020 CLINICAL DATA:  Shortness of breath and cough beginning 10 days ago, history of COPD, former smoker EXAM: PORTABLE CHEST 1 VIEW COMPARISON:  Portable exam 1628 hours without priors available for comparison FINDINGS: Normal heart size, mediastinal contours, and pulmonary vascularity. Scattered infiltrates throughout both lungs, could represent pulmonary edema or atypical infection. Minimal central peribronchial thickening. No pleural effusion or pneumothorax. Osseous structures unremarkable. IMPRESSION: Scattered interstitial infiltrates throughout both lungs, question pulmonary edema versus atypical infection. Electronically Signed   By: Ulyses Southward M.D.   On: 09/09/2020 16:37    Catarina Hartshorn, DO  [facility]  If 7PM-7AM, please contact night-coverage [email or web address] Password [email or web address] 09/19/2020, 4:00 PM   LOS: 10 days

## 2020-09-19 NOTE — Plan of Care (Signed)

## 2020-09-20 LAB — CBC
HCT: 33.3 % — ABNORMAL LOW (ref 36.0–46.0)
Hemoglobin: 11 g/dL — ABNORMAL LOW (ref 12.0–15.0)
MCH: 32.5 pg (ref 26.0–34.0)
MCHC: 33 g/dL (ref 30.0–36.0)
MCV: 98.5 fL (ref 80.0–100.0)
Platelets: 395 10*3/uL (ref 150–400)
RBC: 3.38 MIL/uL — ABNORMAL LOW (ref 3.87–5.11)
RDW: 13.9 % (ref 11.5–15.5)
WBC: 12.4 10*3/uL — ABNORMAL HIGH (ref 4.0–10.5)
nRBC: 0 % (ref 0.0–0.2)

## 2020-09-20 LAB — COMPREHENSIVE METABOLIC PANEL
ALT: 24 U/L (ref 0–44)
AST: 12 U/L — ABNORMAL LOW (ref 15–41)
Albumin: 2.8 g/dL — ABNORMAL LOW (ref 3.5–5.0)
Alkaline Phosphatase: 57 U/L (ref 38–126)
Anion gap: 7 (ref 5–15)
BUN: 29 mg/dL — ABNORMAL HIGH (ref 8–23)
CO2: 24 mmol/L (ref 22–32)
Calcium: 8.3 mg/dL — ABNORMAL LOW (ref 8.9–10.3)
Chloride: 109 mmol/L (ref 98–111)
Creatinine, Ser: 0.63 mg/dL (ref 0.44–1.00)
GFR, Estimated: >60 mL/min (ref >60)
Glucose, Bld: 116 mg/dL — ABNORMAL HIGH (ref 70–99)
Potassium: 3.9 mmol/L (ref 3.5–5.1)
Sodium: 140 mmol/L (ref 135–145)
Total Bilirubin: 0.7 mg/dL (ref 0.3–1.2)
Total Protein: 5.7 g/dL — ABNORMAL LOW (ref 6.5–8.1)

## 2020-09-20 LAB — MAGNESIUM: Magnesium: 2.4 mg/dL (ref 1.7–2.4)

## 2020-09-20 MED ORDER — FUROSEMIDE 40 MG PO TABS
40.0000 mg | ORAL_TABLET | Freq: Once | ORAL | Status: AC
  Administered 2020-09-20: 40 mg via ORAL
  Filled 2020-09-20: qty 1

## 2020-09-20 NOTE — Progress Notes (Signed)
PROGRESS NOTE  Sarah Choi GNF:[id number] DOB: 09-22-1955 DOA: 09/09/2020 PCP: [facility]  Brief History: Kavya Haag a 65 y.o.femalewith PMH significant for hypothyroidism, COPD, unvaccinated to Covid. Patient presented to the ED on 1/6 with complaint of shortness of breath and cough that began 10 days ago.  In the ED, oxygen saturation noted to be 80% on room air, required 4 L by nasal cannula to maintain more than 90%. Covid PCR positive. Procalcitonin negative, CRP elevated Chest x-ray showed scattered interstitial infiltrates throughout both lungs Patient was admitted for respiratory failure secondary to Covid related pneumonia.She was started on remdesivir, IV steroids and baricitinib.  Assessment/Plan: COVID pneumonia Acute respiratory failure with hypoxia  -Presented with progressively worsening cough, shortness of breath -COVID test: PCR positive on admission -Chestxray: Multifocal pneumonia -PCT <0.10 -Treatment:She completed 5 days of remdesivir on 1/10, -currently onSolu-Medrol IV twice daily--restart 1/12  -Since 1/7, patient was also started on baricitinib. -Progression: Currently on 15L HFNC>>10>>6L with saturation 90% -inflammatory markers slowly improvingoverall -Continue to monitor -Oxygen -SpO2:85-90% on 10L(better reading on ear) O2 Flow Rate (L/min):10L/min>>6L -Supportive care: Vitamin C, Zinc, PRN inhalers, Tylenol, Antitussives (benzonatate/ Mucinex/Tussionex).  -Encouraged incentive spirometry, prone position, out of bed and early mobilization as much as possible -Continue airborne/contact isolation precautions for duration of 3 weeks from the day of diagnosis. -inflammatory markers trended -09/16/20--CTA chest--neg for PE;Diffuse peripheral predominant ground-glass opacities throughout both lungs typical of COVID pneumonia  Hypothyroidism -Continue Synthroid  COPD -On steroids and bronchodilators as  stated above. -approx 30 pack years   Status is: Inpatient  Remains inpatient appropriate because:IV treatments appropriate due to intensity of illness or inability to take PO   Dispo: The patient is from:Home Anticipated d/c is QI:ONGE Anticipated d/c date is: 2 days Patient currently is not medically stable to d/c.        Family Communication:noFamily at bedside  Consultants:none  Code Status: FULL   DVT Prophylaxis: [place] Lovenox   Procedures: As Listed in Progress Note Above  Antibiotics: None   Subjective: Patient denies fevers, chills, headache, chest pain, dyspnea, nausea, vomiting, diarrhea, abdominal pain, dysuria, hematuria, hematochezia, and melena.   Objective: Vitals:   09/20/20 0000 09/20/20 0400 09/20/20 0729 09/20/20 0826  BP:  110/62    Pulse:  87  67  Resp:  (!) 22    Temp:  98.3 F (36.8 C)  97.9 F (36.6 C)  TempSrc:  Oral    SpO2: 91% 90% 91% 91%  Weight:      Height:       No intake or output data in the 24 hours ending 09/20/20 1629 Weight change:  Exam:   General:  Pt is alert, follows commands appropriately, not in acute distress  HEENT: No icterus, No thrush, No neck mass, [place]/AT  Cardiovascular: RRR, S1/S2, no rubs, no gallops  Respiratory: bilateral rales. No wheeze  Abdomen: Soft/+BS, non tender, non distended, no guarding  Extremities: Nonpitting edema, No lymphangitis, No petechiae, No rashes, no synovitis   Data Reviewed: I have personally reviewed following labs and imaging studies Basic Metabolic Panel: Recent Labs  Lab 09/14/20 0614 09/16/20 0618 09/17/20 0420 09/18/20 0638 09/20/20 0500  NA 138 138 138 137 140  K 3.6 4.2 3.8 3.6 3.9  CL 107 106 107 105 109  CO2 23 24 22 23 24   GLUCOSE 89 135* 161* 144* 116*  BUN 28* 20 21 25* 29*  CREATININE 0.70 0.64 0.64 0.64  0.63  CALCIUM 8.5* 8.6* 8.7* 8.6* 8.3*  MG  --  2.4  --  2.3 2.4  PHOS   --   --   --  3.7  --    Liver Function Tests: Recent Labs  Lab 09/14/20 0614 09/16/20 0618 09/17/20 0420 09/18/20 0638 09/20/20 0500  AST 16 13* 12* 11* 12*  ALT 39 27 25 24 24   ALKPHOS 53 54 61 69 57  BILITOT 0.8 0.8 0.7 0.5 0.7  PROT 6.3* 6.0* 6.2* 6.4* 5.7*  ALBUMIN 3.0* 2.8* 2.9* 3.0* 2.8*   No results for input(s): LIPASE, AMYLASE in the last 168 hours. No results for input(s): AMMONIA in the last 168 hours. Coagulation Profile: No results for input(s): INR, PROTIME in the last 168 hours. CBC: Recent Labs  Lab 09/14/20 0614 09/16/20 0618 09/17/20 0420 09/18/20 0638 09/20/20 0500  WBC 8.9 7.8 12.2* 12.5* 12.4*  NEUTROABS 6.6  --   --   --   --   HGB 11.9* 11.2* 11.6* 12.1 11.0*  HCT 36.4 34.7* 35.7* 37.5 33.3*  MCV 98.1 97.7 98.3 99.5 98.5  PLT 260 302 382 437* 395   Cardiac Enzymes: No results for input(s): CKTOTAL, CKMB, CKMBINDEX, TROPONINI in the last 168 hours. BNP: Invalid input(s): POCBNP CBG: No results for input(s): GLUCAP in the last 168 hours. HbA1C: No results for input(s): HGBA1C in the last 72 hours. Urine analysis: No results found for: COLORURINE, APPEARANCEUR, LABSPEC, PHURINE, GLUCOSEU, HGBUR, BILIRUBINUR, KETONESUR, PROTEINUR, UROBILINOGEN, NITRITE, LEUKOCYTESUR Sepsis Labs: @LABRCNTIP (procalcitonin:4,lacticidven:4) )No results found for this or any previous visit (from the past 240 hour(s)).   Scheduled Meds: . vitamin C  500 mg Oral BID  . baricitinib  4 mg Oral Daily  . enoxaparin (LOVENOX) injection  40 mg Subcutaneous Q24H  . fluticasone furoate-vilanterol  1 puff Inhalation Daily  . guaiFENesin  1,200 mg Oral BID  . levothyroxine  112 mcg Oral Daily  . methylPREDNISolone (SOLU-MEDROL) injection  70 mg Intravenous Q12H  . nystatin  5 mL Oral QID  . pantoprazole  40 mg Oral Daily  . sodium chloride  2 spray Each Nare Q4H while awake  . umeclidinium bromide  1 puff Inhalation Daily  . zinc sulfate  220 mg Oral Daily    Continuous Infusions: . sodium chloride 250 mL (09/10/20 1743)    Procedures/Studies: CT ANGIO CHEST PE W OR WO CONTRAST  Result Date: 09/16/2020 CLINICAL DATA:  PE suspected, low/intermediate prob, positive D-dimer COVID 19, sob, hypoxia EXAM: CT ANGIOGRAPHY CHEST WITH CONTRAST TECHNIQUE: Multidetector CT imaging of the chest was performed using the standard protocol during bolus administration of intravenous contrast. Multiplanar CT image reconstructions and MIPs were obtained to evaluate the vascular anatomy. CONTRAST:  58mL OMNIPAQUE IOHEXOL 350 MG/ML SOLN COMPARISON:  Radiograph 09/09/2020 FINDINGS: Cardiovascular: There are no filling defects within the pulmonary arteries to suggest pulmonary embolus. Mild aortic atherosclerosis without dissection or acute aortic findings. No aortic aneurysm. Common origin of the brachiocephalic and left common carotid artery, left vertebral artery arises directly from the thoracic aorta, variant arch anatomy. Heart is normal in size. No pericardial effusion. Mediastinum/Nodes: Enlarged mediastinal or hilar lymph nodes. No axillary adenopathy. Punctate right thyroid calcification needs no further follow-up. Tiny hiatal hernia. No esophageal wall thickening. No pneumomediastinum. Lungs/Pleura: Diffuse peripheral predominant ground-glass opacities throughout both lungs typical of COVID pneumonia. There is scattered central bronchial thickening. Mild apical predominant emphysema. No pneumothorax or pleural fluid. Upper Abdomen: No acute or unexpected findings. Musculoskeletal: There are no  acute or suspicious osseous abnormalities. Review of the MIP images confirms the above findings. IMPRESSION: 1. No pulmonary embolus. 2. Diffuse peripheral predominant ground-glass opacities throughout both lungs typical of COVID pneumonia. Aortic Atherosclerosis (ICD10-I70.0) and Emphysema (ICD10-J43.9). Electronically Signed   By: Narda Rutherford M.D.   On: 09/16/2020 20:11   DG  Chest Port 1 View  Result Date: 09/09/2020 CLINICAL DATA:  Shortness of breath and cough beginning 10 days ago, history of COPD, former smoker EXAM: PORTABLE CHEST 1 VIEW COMPARISON:  Portable exam 1628 hours without priors available for comparison FINDINGS: Normal heart size, mediastinal contours, and pulmonary vascularity. Scattered infiltrates throughout both lungs, could represent pulmonary edema or atypical infection. Minimal central peribronchial thickening. No pleural effusion or pneumothorax. Osseous structures unremarkable. IMPRESSION: Scattered interstitial infiltrates throughout both lungs, question pulmonary edema versus atypical infection. Electronically Signed   By: Ulyses Southward M.D.   On: 09/09/2020 16:37    Catarina Hartshorn, DO  [facility]  If 7PM-7AM, please contact night-coverage [email or web address] Password [email or web address] 09/20/2020, 4:29 PM   LOS: 11 days

## 2020-09-20 NOTE — Plan of Care (Signed)

## 2020-09-21 LAB — CBC
HCT: 38.3 % (ref 36.0–46.0)
Hemoglobin: 12.5 g/dL (ref 12.0–15.0)
MCH: 32.4 pg (ref 26.0–34.0)
MCHC: 32.6 g/dL (ref 30.0–36.0)
MCV: 99.2 fL (ref 80.0–100.0)
Platelets: 370 10*3/uL (ref 150–400)
RBC: 3.86 MIL/uL — ABNORMAL LOW (ref 3.87–5.11)
RDW: 14.2 % (ref 11.5–15.5)
WBC: 10.2 10*3/uL (ref 4.0–10.5)
nRBC: 0 % (ref 0.0–0.2)

## 2020-09-21 LAB — COMPREHENSIVE METABOLIC PANEL
ALT: 27 U/L (ref 0–44)
AST: 13 U/L — ABNORMAL LOW (ref 15–41)
Albumin: 3.1 g/dL — ABNORMAL LOW (ref 3.5–5.0)
Alkaline Phosphatase: 65 U/L (ref 38–126)
Anion gap: 8 (ref 5–15)
BUN: 32 mg/dL — ABNORMAL HIGH (ref 8–23)
CO2: 29 mmol/L (ref 22–32)
Calcium: 8.8 mg/dL — ABNORMAL LOW (ref 8.9–10.3)
Chloride: 105 mmol/L (ref 98–111)
Creatinine, Ser: 0.74 mg/dL (ref 0.44–1.00)
GFR, Estimated: >60 mL/min (ref >60)
Glucose, Bld: 126 mg/dL — ABNORMAL HIGH (ref 70–99)
Potassium: 3.5 mmol/L (ref 3.5–5.1)
Sodium: 142 mmol/L (ref 135–145)
Total Bilirubin: 0.5 mg/dL (ref 0.3–1.2)
Total Protein: 6.3 g/dL — ABNORMAL LOW (ref 6.5–8.1)

## 2020-09-21 LAB — C-REACTIVE PROTEIN: CRP: 0.7 mg/dL (ref <1.0)

## 2020-09-21 LAB — FERRITIN: Ferritin: 482 ng/mL — ABNORMAL HIGH (ref 11–307)

## 2020-09-21 MED ORDER — FUROSEMIDE 20 MG PO TABS
20.0000 mg | ORAL_TABLET | Freq: Once | ORAL | Status: AC
  Administered 2020-09-21: 20 mg via ORAL
  Filled 2020-09-21: qty 1

## 2020-09-21 MED ORDER — POTASSIUM CHLORIDE CRYS ER 20 MEQ PO TBCR
20.0000 meq | EXTENDED_RELEASE_TABLET | Freq: Once | ORAL | Status: AC
  Administered 2020-09-21: 20 meq via ORAL
  Filled 2020-09-21: qty 1

## 2020-09-21 NOTE — Progress Notes (Addendum)
PROGRESS NOTE  Sarah Choi YIF:[id number] DOB: Jun 20, 1956 DOA: 09/09/2020 PCP: [facility] Brief History: Lona Six a 65 y.o.femalewith PMH significant for hypothyroidism, COPD, unvaccinated to Covid. Patient presented to the ED on 1/6 with complaint of shortness of breath and cough that began 10 days ago.  In the ED, oxygen saturation noted to be 80% on room air, required 4 L by nasal cannula to maintain more than 90%. Covid PCR positive. Procalcitonin negative, CRP elevated Chest x-ray showed scattered interstitial infiltrates throughout both lungs Patient was admitted for respiratory failure secondary to Covid related pneumonia.She was started on remdesivir, IV steroids and baricitinib.  Assessment/Plan: COVID pneumonia Acute respiratory failure with hypoxia  -Presented with progressively worsening cough, shortness of breath -COVID test: PCR positive on admission -Chestxray: Multifocal pneumonia -PCT <0.10 -Treatment:She completed 5 days of remdesivir on 1/10, -currently onSolu-Medrol IV twice daily--restart 1/12  -Since 1/7, patient was also started on baricitinib. -Progression: Currently on 15L HFNC>>10>>6L with saturation 90% -inflammatory markers slowly improvingoverall -Oxygen -SpO2:85-90% on 10L(better reading on ear) O2 Flow Rate (L/min):10L/min>>6L -Supportive care: Vitamin C, Zinc, PRN inhalers, Tylenol, Antitussives (benzonatate/ Mucinex/Tussionex).  -Encouraged incentive spirometry, prone position, out of bed and early mobilization as much as possible -Continue airborne/contact isolation precautions for duration of 3 weeks from the day of diagnosis. -inflammatory markers trended -09/16/20--CTA chest--neg for PE;Diffuse peripheral predominant ground-glass opacities throughout both lungs typical of COVID pneumonia  Hypothyroidism -Continue Synthroid  COPD -On steroids and bronchodilators as stated above. -approx 30  pack years   Status is: Inpatient  Remains inpatient appropriate because:IV treatments appropriate due to intensity of illness or inability to take PO   Dispo: The patient is from:Home Anticipated d/c is OM:VEHM Anticipated d/c date is: 2 days Patient currently is not medically stable to d/c.        Family Communication:noFamily at bedside  Consultants:none  Code Status: FULL   DVT Prophylaxis: [place] Lovenox   Procedures: As Listed in Progress Note Above  Antibiotics: None     Subjective: Patient denies fevers, chills, headache, chest pain, dyspnea, nausea, vomiting, diarrhea, abdominal pain, dysuria, hematuria, hematochezia, and melena.   Objective: Vitals:   09/21/20 0500 09/21/20 0645 09/21/20 1403 09/21/20 1428  BP: 104/67   99/66  Pulse: (!) 58     Resp: 20   20  Temp: 97.7 F (36.5 C)  98.7 F (37.1 C) 97.9 F (36.6 C)  TempSrc: Oral     SpO2: 90% 91%  92%  Weight:      Height:        Intake/Output Summary (Last 24 hours) at 09/21/2020 1516 Last data filed at 09/21/2020 1430 Gross per 24 hour  Intake 960 ml  Output 1500 ml  Net -540 ml   Weight change:  Exam:   General:  Pt is alert, follows commands appropriately, not in acute distress  HEENT: No icterus, No thrush, No neck mass, [place]/AT  Cardiovascular: RRR, S1/S2, no rubs, no gallops  Respiratory: bibasilar rales. No wheeze  Abdomen: Soft/+BS, non tender, non distended, no guarding  Extremities: No edema, No lymphangitis, No petechiae, No rashes, no synovitis   Data Reviewed: I have personally reviewed following labs and imaging studies Basic Metabolic Panel: Recent Labs  Lab 09/16/20 0618 09/17/20 0420 09/18/20 0638 09/20/20 0500 09/21/20 0508  NA 138 138 137 140 142  K 4.2 3.8 3.6 3.9 3.5  CL 106 107 105 109 105  CO2 24 22 23  24 29  GLUCOSE 135* 161* 144* 116* 126*  BUN 20 21 25* 29* 32*  CREATININE  0.64 0.64 0.64 0.63 0.74  CALCIUM 8.6* 8.7* 8.6* 8.3* 8.8*  MG 2.4  --  2.3 2.4  --   PHOS  --   --  3.7  --   --    Liver Function Tests: Recent Labs  Lab 09/16/20 0618 09/17/20 0420 09/18/20 0638 09/20/20 0500 09/21/20 0508  AST 13* 12* 11* 12* 13*  ALT 27 25 24 24 27   ALKPHOS 54 61 69 57 65  BILITOT 0.8 0.7 0.5 0.7 0.5  PROT 6.0* 6.2* 6.4* 5.7* 6.3*  ALBUMIN 2.8* 2.9* 3.0* 2.8* 3.1*   No results for input(s): LIPASE, AMYLASE in the last 168 hours. No results for input(s): AMMONIA in the last 168 hours. Coagulation Profile: No results for input(s): INR, PROTIME in the last 168 hours. CBC: Recent Labs  Lab 09/16/20 0618 09/17/20 0420 09/18/20 0638 09/20/20 0500 09/21/20 0508  WBC 7.8 12.2* 12.5* 12.4* 10.2  HGB 11.2* 11.6* 12.1 11.0* 12.5  HCT 34.7* 35.7* 37.5 33.3* 38.3  MCV 97.7 98.3 99.5 98.5 99.2  PLT 302 382 437* 395 370   Cardiac Enzymes: No results for input(s): CKTOTAL, CKMB, CKMBINDEX, TROPONINI in the last 168 hours. BNP: Invalid input(s): POCBNP CBG: No results for input(s): GLUCAP in the last 168 hours. HbA1C: No results for input(s): HGBA1C in the last 72 hours. Urine analysis: No results found for: COLORURINE, APPEARANCEUR, LABSPEC, PHURINE, GLUCOSEU, HGBUR, BILIRUBINUR, KETONESUR, PROTEINUR, UROBILINOGEN, NITRITE, LEUKOCYTESUR Sepsis Labs: @LABRCNTIP (procalcitonin:4,lacticidven:4) )No results found for this or any previous visit (from the past 240 hour(s)).   Scheduled Meds: . vitamin C  500 mg Oral BID  . baricitinib  4 mg Oral Daily  . enoxaparin (LOVENOX) injection  40 mg Subcutaneous Q24H  . fluticasone furoate-vilanterol  1 puff Inhalation Daily  . furosemide  20 mg Oral Once  . guaiFENesin  1,200 mg Oral BID  . levothyroxine  112 mcg Oral Daily  . methylPREDNISolone (SOLU-MEDROL) injection  70 mg Intravenous Q12H  . nystatin  5 mL Oral QID  . pantoprazole  40 mg Oral Daily  . potassium chloride  20 mEq Oral Once  . sodium chloride   2 spray Each Nare Q4H while awake  . umeclidinium bromide  1 puff Inhalation Daily  . zinc sulfate  220 mg Oral Daily   Continuous Infusions: . sodium chloride 250 mL (09/10/20 1743)    Procedures/Studies: CT ANGIO CHEST PE W OR WO CONTRAST  Result Date: 09/16/2020 CLINICAL DATA:  PE suspected, low/intermediate prob, positive D-dimer COVID 19, sob, hypoxia EXAM: CT ANGIOGRAPHY CHEST WITH CONTRAST TECHNIQUE: Multidetector CT imaging of the chest was performed using the standard protocol during bolus administration of intravenous contrast. Multiplanar CT image reconstructions and MIPs were obtained to evaluate the vascular anatomy. CONTRAST:  62mL OMNIPAQUE IOHEXOL 350 MG/ML SOLN COMPARISON:  Radiograph 09/09/2020 FINDINGS: Cardiovascular: There are no filling defects within the pulmonary arteries to suggest pulmonary embolus. Mild aortic atherosclerosis without dissection or acute aortic findings. No aortic aneurysm. Common origin of the brachiocephalic and left common carotid artery, left vertebral artery arises directly from the thoracic aorta, variant arch anatomy. Heart is normal in size. No pericardial effusion. Mediastinum/Nodes: Enlarged mediastinal or hilar lymph nodes. No axillary adenopathy. Punctate right thyroid calcification needs no further follow-up. Tiny hiatal hernia. No esophageal wall thickening. No pneumomediastinum. Lungs/Pleura: Diffuse peripheral predominant ground-glass opacities throughout both lungs typical of COVID pneumonia. There is  scattered central bronchial thickening. Mild apical predominant emphysema. No pneumothorax or pleural fluid. Upper Abdomen: No acute or unexpected findings. Musculoskeletal: There are no acute or suspicious osseous abnormalities. Review of the MIP images confirms the above findings. IMPRESSION: 1. No pulmonary embolus. 2. Diffuse peripheral predominant ground-glass opacities throughout both lungs typical of COVID pneumonia. Aortic Atherosclerosis  (ICD10-I70.0) and Emphysema (ICD10-J43.9). Electronically Signed   By: Narda Rutherford M.D.   On: 09/16/2020 20:11   DG Chest Port 1 View  Result Date: 09/09/2020 CLINICAL DATA:  Shortness of breath and cough beginning 10 days ago, history of COPD, former smoker EXAM: PORTABLE CHEST 1 VIEW COMPARISON:  Portable exam 1628 hours without priors available for comparison FINDINGS: Normal heart size, mediastinal contours, and pulmonary vascularity. Scattered infiltrates throughout both lungs, could represent pulmonary edema or atypical infection. Minimal central peribronchial thickening. No pleural effusion or pneumothorax. Osseous structures unremarkable. IMPRESSION: Scattered interstitial infiltrates throughout both lungs, question pulmonary edema versus atypical infection. Electronically Signed   By: Ulyses Southward M.D.   On: 09/09/2020 16:37    Catarina Hartshorn, DO  [facility]  If 7PM-7AM, please contact night-coverage [email or web address] Password [email or web address] 09/21/2020, 3:16 PM   LOS: 12 days

## 2020-09-21 NOTE — Progress Notes (Signed)
Pt was on 6L of o2 this AM. Throughout the day I have slowly weaned her o2. At 5.5L she was at 96%. She is currently on 4L o2 and is maintaining at 93%

## 2020-09-22 DIAGNOSIS — J1282 Pneumonia due to coronavirus disease 2019: Secondary | ICD-10-CM

## 2020-09-22 NOTE — Progress Notes (Signed)
PROGRESS NOTE  Sarah Choi PFX:[id number] DOB: 02-Sep-1956 DOA: 09/09/2020 PCP: [facility]   Brief History: Sarah Choi a 65 y.o.femalewith PMH significant for hypothyroidism, COPD, unvaccinated to Covid. Patient presented to the ED on 1/6 with complaint of shortness of breath and cough that began 10 days ago.  In the ED, oxygen saturation noted to be 80% on room air, required 4 L by nasal cannula to maintain more than 90%. Covid PCR positive. Procalcitonin negative, CRP elevated Chest x-ray showed scattered interstitial infiltrates throughout both lungs Patient was admitted for respiratory failure secondary to Covid related pneumonia.She was started on remdesivir, IV steroids and baricitinib.  Assessment/Plan: COVID pneumonia Acute respiratory failure with hypoxia  -Presented with progressively worsening cough, shortness of breath -COVID test: PCR positive on admission -Chestxray: Multifocal pneumonia -PCT <0.10; holding on antibiotics. -Treatment:She completed 5 days of remdesivir on 1/10, -Continue IV steroids -Continue baricitinib (currently day 13/14) -Progression: Currently on 4-6 L with saturation 90% -inflammatory markers slowly improvingoverall -Supportive care: Vitamin C, Zinc, PRN inhalers, Tylenol, Antitussives (benzonatate/ Mucinex/Tussionex).  -Encouraged incentive spirometry, prone position, out of bed and early mobilization as much as possible -Continue airborne/contact isolation precautions for duration of 3 weeks from the day of diagnosis. -inflammatory markers trended -09/16/20--CTA chest--neg for PE;Diffuse peripheral predominant ground-glass opacities throughout both lungs typical of COVID pneumonia -Continue supportive care.  Hypothyroidism -Continue Synthroid  COPD -On steroids and bronchodilators as stated above. -approx 30 pack years   Status is: Inpatient   Dispo: The patient is  from:Home Anticipated d/c is HG:DJME Anticipated d/c date is: 2-3 days Patient currently is not medically stable to d/c.  Still requiring midodrine 4-6 L nasal cannula supplementation and experience desaturation with activity.    Family Communication:noFamily at bedside  Consultants:none  Code Status: FULL   DVT Prophylaxis: [place] Lovenox   Procedures: As Listed in Progress Note Above  Antibiotics: None   Subjective: No fever, no headaches, no chest pain, no nausea, no vomiting, no dysuria.  Still requiring 4-6 L nasal cannula supplementation.  Short winded with activity and experiencing intermittent coughing spells.   Objective: Vitals:   09/21/20 2057 09/22/20 0400 09/22/20 0858 09/22/20 1425  BP: 125/77 115/68  (!) 104/59  Pulse: 85 72  84  Resp: 20 18  20   Temp: 98 F (36.7 C) 97.6 F (36.4 C)  98.3 F (36.8 C)  TempSrc: Oral Oral  Oral  SpO2: (!) 89%  90% 91%  Weight:      Height:       No intake or output data in the 24 hours ending 09/22/20 1708 Weight change:   Exam: General exam: Alert, awake, oriented x 3, cooperative with examination and following commands appropriately.  Patient expressed short winded sensation with activity, intermittent productive coughing spells and is still requiring between 4 and 6 L nasal cannula supplementation.  Patient is afebrile. Respiratory system: No wheezing, no crackles, no using accessory muscle.  Happy hypoxia level appreciated at rest with 4 L nasal cannula supplementation.  Patient requiring up to 6 L supplementation with activity due to desaturation episodes. Cardiovascular system:RRR. No murmurs, rubs, gallops.  No JVD. Gastrointestinal system: Abdomen is nondistended, soft and nontender. No organomegaly or masses felt. Normal bowel sounds heard. Central nervous system: Alert and oriented. No focal neurological deficits. Extremities: No cyanosis or  clubbing. Skin: No petechiae. Psychiatry: Judgement and insight appear normal. Mood & affect appropriate.   Data Reviewed: I have  personally reviewed following labs and imaging studies  Basic Metabolic Panel: Recent Labs  Lab 09/16/20 0618 09/17/20 0420 09/18/20 0638 09/20/20 0500 09/21/20 0508  NA 138 138 137 140 142  K 4.2 3.8 3.6 3.9 3.5  CL 106 107 105 109 105  CO2 24 22 23 24 29   GLUCOSE 135* 161* 144* 116* 126*  BUN 20 21 25* 29* 32*  CREATININE 0.64 0.64 0.64 0.63 0.74  CALCIUM 8.6* 8.7* 8.6* 8.3* 8.8*  MG 2.4  --  2.3 2.4  --   PHOS  --   --  3.7  --   --    Liver Function Tests: Recent Labs  Lab 09/16/20 0618 09/17/20 0420 09/18/20 0638 09/20/20 0500 09/21/20 0508  AST 13* 12* 11* 12* 13*  ALT 27 25 24 24 27   ALKPHOS 54 61 69 57 65  BILITOT 0.8 0.7 0.5 0.7 0.5  PROT 6.0* 6.2* 6.4* 5.7* 6.3*  ALBUMIN 2.8* 2.9* 3.0* 2.8* 3.1*   CBC: Recent Labs  Lab 09/16/20 0618 09/17/20 0420 09/18/20 0638 09/20/20 0500 09/21/20 0508  WBC 7.8 12.2* 12.5* 12.4* 10.2  HGB 11.2* 11.6* 12.1 11.0* 12.5  HCT 34.7* 35.7* 37.5 33.3* 38.3  MCV 97.7 98.3 99.5 98.5 99.2  PLT 302 382 437* 395 370   Scheduled Meds: . vitamin C  500 mg Oral BID  . enoxaparin (LOVENOX) injection  40 mg Subcutaneous Q24H  . fluticasone furoate-vilanterol  1 puff Inhalation Daily  . guaiFENesin  1,200 mg Oral BID  . levothyroxine  112 mcg Oral Daily  . methylPREDNISolone (SOLU-MEDROL) injection  70 mg Intravenous Q12H  . nystatin  5 mL Oral QID  . pantoprazole  40 mg Oral Daily  . sodium chloride  2 spray Each Nare Q4H while awake  . umeclidinium bromide  1 puff Inhalation Daily  . zinc sulfate  220 mg Oral Daily   Continuous Infusions: . sodium chloride 250 mL (09/10/20 1743)    Procedures/Studies: CT ANGIO CHEST PE W OR WO CONTRAST  Result Date: 09/16/2020 CLINICAL DATA:  PE suspected, low/intermediate prob, positive D-dimer COVID 19, sob, hypoxia EXAM: CT ANGIOGRAPHY CHEST WITH  CONTRAST TECHNIQUE: Multidetector CT imaging of the chest was performed using the standard protocol during bolus administration of intravenous contrast. Multiplanar CT image reconstructions and MIPs were obtained to evaluate the vascular anatomy. CONTRAST:  62mL OMNIPAQUE IOHEXOL 350 MG/ML SOLN COMPARISON:  Radiograph 09/09/2020 FINDINGS: Cardiovascular: There are no filling defects within the pulmonary arteries to suggest pulmonary embolus. Mild aortic atherosclerosis without dissection or acute aortic findings. No aortic aneurysm. Common origin of the brachiocephalic and left common carotid artery, left vertebral artery arises directly from the thoracic aorta, variant arch anatomy. Heart is normal in size. No pericardial effusion. Mediastinum/Nodes: Enlarged mediastinal or hilar lymph nodes. No axillary adenopathy. Punctate right thyroid calcification needs no further follow-up. Tiny hiatal hernia. No esophageal wall thickening. No pneumomediastinum. Lungs/Pleura: Diffuse peripheral predominant ground-glass opacities throughout both lungs typical of COVID pneumonia. There is scattered central bronchial thickening. Mild apical predominant emphysema. No pneumothorax or pleural fluid. Upper Abdomen: No acute or unexpected findings. Musculoskeletal: There are no acute or suspicious osseous abnormalities. Review of the MIP images confirms the above findings. IMPRESSION: 1. No pulmonary embolus. 2. Diffuse peripheral predominant ground-glass opacities throughout both lungs typical of COVID pneumonia. Aortic Atherosclerosis (ICD10-I70.0) and Emphysema (ICD10-J43.9). Electronically Signed   By: 72m M.D.   On: 09/16/2020 20:11   DG Chest Port 1 View  Result Date: 09/09/2020  CLINICAL DATA:  Shortness of breath and cough beginning 10 days ago, history of COPD, former smoker EXAM: PORTABLE CHEST 1 VIEW COMPARISON:  Portable exam 1628 hours without priors available for comparison FINDINGS: Normal heart size,  mediastinal contours, and pulmonary vascularity. Scattered infiltrates throughout both lungs, could represent pulmonary edema or atypical infection. Minimal central peribronchial thickening. No pleural effusion or pneumothorax. Osseous structures unremarkable. IMPRESSION: Scattered interstitial infiltrates throughout both lungs, question pulmonary edema versus atypical infection. Electronically Signed   By: Ulyses Southward M.D.   On: 09/09/2020 16:37    Vassie Loll, MD  [facility]  If 7PM-7AM, please contact night-coverage [email or web address] Password [email or web address] 09/22/2020, 5:08 PM   LOS: 13 days

## 2020-09-22 NOTE — Progress Notes (Addendum)
91% on 4 liters sitting.  Ambulated in room and dropped to 70's on 4 liters..Increased to 6 liters while standing and O2 rose only to 84.   Talks on phone a good part of day and requests lots of snacks and drinks from staff

## 2020-09-23 NOTE — Progress Notes (Signed)
PROGRESS NOTE  Nonie Lochner GYK:[id number] DOB: 08-30-1956 DOA: 09/09/2020 PCP: [facility]   Brief History: Wendolyn Raso a 65 y.o.femalewith PMH significant for hypothyroidism, COPD, unvaccinated to Covid. Patient presented to the ED on 1/6 with complaint of shortness of breath and cough that began 10 days ago.  In the ED, oxygen saturation noted to be 80% on room air, required 4 L by nasal cannula to maintain more than 90%. Covid PCR positive. Procalcitonin negative, CRP elevated Chest x-ray showed scattered interstitial infiltrates throughout both lungs Patient was admitted for respiratory failure secondary to Covid related pneumonia.She was started on remdesivir, IV steroids and baricitinib.  Assessment/Plan: COVID pneumonia Acute respiratory failure with hypoxia  -Presented with progressively worsening cough, shortness of breath -COVID test: PCR positive on admission -Chestxray: Multifocal pneumonia -PCT <0.10; continue holding on antibiotics. -Treatment:She completed 5 days of remdesivir on 1/10, -Continue current IV steroids dose. -Continue baricitinib (currently day 14/14) -Progression: Currently on 4 liters at rest and on 6 L with ambulation desaturating to 89%.  -inflammatory markers slowly improvingoverall -Supportive care: Vitamin C, Zinc, PRN inhalers, Tylenol, Antitussives (benzonatate/ Mucinex/Tussionex).  -Encouraged incentive spirometry, prone position, out of bed and early mobilization as much as possible -Continue airborne/contact isolation precautions for duration of 3 weeks from the day of diagnosis. -Continue to follow inflammatory markers trend intermittently. -09/16/20--CTA chest--neg for PE;Diffuse peripheral predominant ground-glass opacities throughout both lungs typical of COVID pneumonia -Continue supportive care.  Hypothyroidism -Continue Synthroid at current dose.  COPD -On steroids and bronchodilators as  stated above. -approx 30 pack years history -No wheezing currently   Status is: Inpatient   Dispo: The patient is from:Home Anticipated d/c is BL:TJQZ Anticipated d/c date is: 2-3 days Patient currently is not medically stable to d/c.  Still requiring midodrine 4-6 L nasal cannula supplementation and experience desaturation with activity.    Family Communication:noFamily at bedside  Consultants:none  Code Status: FULL   DVT Prophylaxis: [place] Lovenox   Procedures: As Listed in Progress Note Above  Antibiotics: None   Subjective: Reports feeling better, no chest pain, no nausea, no vomiting, no dysuria, no diarrhea.  Patient in good spirits today.  Still short winded with activity and experiencing desaturation with exertion.  Currently afebrile.  Objective: Vitals:   09/22/20 1425 09/22/20 2053 09/23/20 0600 09/23/20 0841  BP: (!) 104/59 118/66 118/72   Pulse: 84 77 67   Resp: 20 20 20    Temp: 98.3 F (36.8 C) 97.6 F (36.4 C) 98.2 F (36.8 C)   TempSrc: Oral Oral Oral   SpO2: 91% 91% 95% (!) 86%  Weight:      Height:       No intake or output data in the 24 hours ending 09/23/20 1226 Weight change:   Exam: General exam: Alert, awake, oriented x 3; no chest pain, no nausea, no vomiting still short winded with exertion required between 4-6 L high flow nasal cannula supplementation.  Patient is afebrile Respiratory system: Good air movement bilaterally; no wheezing, no crackles.  Positive scattered rhonchi. Cardiovascular system:RRR. No murmurs, rubs, gallops.  No JVD. Gastrointestinal system: Abdomen is nondistended, soft and nontender. No organomegaly or masses felt. Normal bowel sounds heard. Central nervous system: Alert and oriented. No focal neurological deficits. Extremities: No cyanosis or clubbing. Skin: No rashes, no petechiae. Psychiatry: Judgement and insight appear normal. Mood & affect  appropriate.   Data Reviewed: I have personally reviewed following labs and imaging  studies  Basic Metabolic Panel: Recent Labs  Lab 09/17/20 0420 09/18/20 0638 09/20/20 0500 09/21/20 0508  NA 138 137 140 142  K 3.8 3.6 3.9 3.5  CL 107 105 109 105  CO2 22 23 24 29   GLUCOSE 161* 144* 116* 126*  BUN 21 25* 29* 32*  CREATININE 0.64 0.64 0.63 0.74  CALCIUM 8.7* 8.6* 8.3* 8.8*  MG  --  2.3 2.4  --   PHOS  --  3.7  --   --    Liver Function Tests: Recent Labs  Lab 09/17/20 0420 09/18/20 0638 09/20/20 0500 09/21/20 0508  AST 12* 11* 12* 13*  ALT 25 24 24 27   ALKPHOS 61 69 57 65  BILITOT 0.7 0.5 0.7 0.5  PROT 6.2* 6.4* 5.7* 6.3*  ALBUMIN 2.9* 3.0* 2.8* 3.1*   CBC: Recent Labs  Lab 09/17/20 0420 09/18/20 0638 09/20/20 0500 09/21/20 0508  WBC 12.2* 12.5* 12.4* 10.2  HGB 11.6* 12.1 11.0* 12.5  HCT 35.7* 37.5 33.3* 38.3  MCV 98.3 99.5 98.5 99.2  PLT 382 437* 395 370   Scheduled Meds: . vitamin C  500 mg Oral BID  . enoxaparin (LOVENOX) injection  40 mg Subcutaneous Q24H  . fluticasone furoate-vilanterol  1 puff Inhalation Daily  . guaiFENesin  1,200 mg Oral BID  . levothyroxine  112 mcg Oral Daily  . methylPREDNISolone (SOLU-MEDROL) injection  70 mg Intravenous Q12H  . nystatin  5 mL Oral QID  . pantoprazole  40 mg Oral Daily  . sodium chloride  2 spray Each Nare Q4H while awake  . umeclidinium bromide  1 puff Inhalation Daily  . zinc sulfate  220 mg Oral Daily   Continuous Infusions: . sodium chloride 250 mL (09/10/20 1743)    Procedures/Studies: CT ANGIO CHEST PE W OR WO CONTRAST  Result Date: 09/16/2020 CLINICAL DATA:  PE suspected, low/intermediate prob, positive D-dimer COVID 19, sob, hypoxia EXAM: CT ANGIOGRAPHY CHEST WITH CONTRAST TECHNIQUE: Multidetector CT imaging of the chest was performed using the standard protocol during bolus administration of intravenous contrast. Multiplanar CT image reconstructions and MIPs were obtained to evaluate the  vascular anatomy. CONTRAST:  4mL OMNIPAQUE IOHEXOL 350 MG/ML SOLN COMPARISON:  Radiograph 09/09/2020 FINDINGS: Cardiovascular: There are no filling defects within the pulmonary arteries to suggest pulmonary embolus. Mild aortic atherosclerosis without dissection or acute aortic findings. No aortic aneurysm. Common origin of the brachiocephalic and left common carotid artery, left vertebral artery arises directly from the thoracic aorta, variant arch anatomy. Heart is normal in size. No pericardial effusion. Mediastinum/Nodes: Enlarged mediastinal or hilar lymph nodes. No axillary adenopathy. Punctate right thyroid calcification needs no further follow-up. Tiny hiatal hernia. No esophageal wall thickening. No pneumomediastinum. Lungs/Pleura: Diffuse peripheral predominant ground-glass opacities throughout both lungs typical of COVID pneumonia. There is scattered central bronchial thickening. Mild apical predominant emphysema. No pneumothorax or pleural fluid. Upper Abdomen: No acute or unexpected findings. Musculoskeletal: There are no acute or suspicious osseous abnormalities. Review of the MIP images confirms the above findings. IMPRESSION: 1. No pulmonary embolus. 2. Diffuse peripheral predominant ground-glass opacities throughout both lungs typical of COVID pneumonia. Aortic Atherosclerosis (ICD10-I70.0) and Emphysema (ICD10-J43.9). Electronically Signed   By: 72m M.D.   On: 09/16/2020 20:11   DG Chest Port 1 View  Result Date: 09/09/2020 CLINICAL DATA:  Shortness of breath and cough beginning 10 days ago, history of COPD, former smoker EXAM: PORTABLE CHEST 1 VIEW COMPARISON:  Portable exam 1628 hours without priors available for comparison FINDINGS: Normal  heart size, mediastinal contours, and pulmonary vascularity. Scattered infiltrates throughout both lungs, could represent pulmonary edema or atypical infection. Minimal central peribronchial thickening. No pleural effusion or pneumothorax.  Osseous structures unremarkable. IMPRESSION: Scattered interstitial infiltrates throughout both lungs, question pulmonary edema versus atypical infection. Electronically Signed   By: Ulyses Southward M.D.   On: 09/09/2020 16:37    Vassie Loll, MD  [facility]  [email or web address] Password [email or web address] 09/23/2020, 12:26 PM   LOS: 14 days

## 2020-09-23 NOTE — Progress Notes (Signed)
SATURATION QUALIFICATIONS: (This note is used to comply with regulatory documentation for home oxygen)  Patient Saturations on Room Air at Rest = 84%  Patient Saturations on Room Air while Ambulating = 74%  Patient Saturations on 6 Liters of oxygen while Ambulating = 89%  Please briefly explain why patient needs home oxygen:Desats on room air

## 2020-09-24 DIAGNOSIS — R0902 Hypoxemia: Secondary | ICD-10-CM

## 2020-09-24 MED ORDER — ALBUTEROL SULFATE HFA 108 (90 BASE) MCG/ACT IN AERS: 2.0000 | INHALATION_SPRAY | Freq: Four times a day (QID) | RESPIRATORY_TRACT | 1 refills | Status: AC | PRN

## 2020-09-24 MED ORDER — ZINC SULFATE 220 (50 ZN) MG PO CAPS: 220.0000 mg | ORAL_CAPSULE | Freq: Every day | ORAL | 1 refills | Status: AC

## 2020-09-24 MED ORDER — NYSTATIN 100000 UNIT/ML MT SUSP: 5.0000 mL | Freq: Four times a day (QID) | OROMUCOSAL | 0 refills | Status: AC

## 2020-09-24 MED ORDER — PANTOPRAZOLE SODIUM 40 MG PO TBEC: 40.0000 mg | DELAYED_RELEASE_TABLET | Freq: Every day | ORAL | 1 refills | Status: AC

## 2020-09-24 MED ORDER — ASCORBIC ACID 500 MG PO TABS: 500.0000 mg | ORAL_TABLET | Freq: Two times a day (BID) | ORAL | 1 refills | Status: AC

## 2020-09-24 MED ORDER — GUAIFENESIN-DM 100-10 MG/5ML PO SYRP: 5.0000 mL | ORAL_SOLUTION | ORAL | 0 refills | Status: AC | PRN

## 2020-09-24 MED ORDER — PREDNISONE 20 MG PO TABS: ORAL_TABLET | ORAL | 0 refills | Status: AC

## 2020-09-24 NOTE — Plan of Care (Signed)
Problem: Education: Goal: Knowledge of General Education information will improve Description: Including pain rating scale, medication(s)/side effects and non-pharmacologic comfort measures 09/24/2020 1225 by Zachery Dakins, RN Outcome: Adequate for Discharge 09/24/2020 1015 by Zachery Dakins, RN Outcome: Progressing   Problem: Health Behavior/Discharge Planning: Goal: Ability to manage health-related needs will improve 09/24/2020 1225 by Zachery Dakins, RN Outcome: Adequate for Discharge 09/24/2020 1015 by Zachery Dakins, RN Outcome: Progressing   Problem: Clinical Measurements: Goal: Ability to maintain clinical measurements within normal limits will improve 09/24/2020 1225 by Zachery Dakins, RN Outcome: Adequate for Discharge 09/24/2020 1015 by Zachery Dakins, RN Outcome: Progressing Goal: Will remain free from infection 09/24/2020 1225 by Zachery Dakins, RN Outcome: Adequate for Discharge 09/24/2020 1015 by Zachery Dakins, RN Outcome: Progressing Goal: Diagnostic test results will improve 09/24/2020 1225 by Zachery Dakins, RN Outcome: Adequate for Discharge 09/24/2020 1015 by Zachery Dakins, RN Outcome: Progressing Goal: Respiratory complications will improve 09/24/2020 1225 by Zachery Dakins, RN Outcome: Adequate for Discharge 09/24/2020 1015 by Zachery Dakins, RN Outcome: Progressing Goal: Cardiovascular complication will be avoided 09/24/2020 1225 by Zachery Dakins, RN Outcome: Adequate for Discharge 09/24/2020 1015 by Zachery Dakins, RN Outcome: Progressing   Problem: Activity: Goal: Risk for activity intolerance will decrease 09/24/2020 1225 by Zachery Dakins, RN Outcome: Adequate for Discharge 09/24/2020 1015 by Zachery Dakins, RN Outcome: Progressing   Problem: Nutrition: Goal: Adequate nutrition will be maintained 09/24/2020 1225 by Zachery Dakins, RN Outcome:  Adequate for Discharge 09/24/2020 1015 by Zachery Dakins, RN Outcome: Progressing   Problem: Coping: Goal: Level of anxiety will decrease 09/24/2020 1225 by Zachery Dakins, RN Outcome: Adequate for Discharge 09/24/2020 1015 by Zachery Dakins, RN Outcome: Progressing   Problem: Elimination: Goal: Will not experience complications related to bowel motility 09/24/2020 1225 by Zachery Dakins, RN Outcome: Adequate for Discharge 09/24/2020 1015 by Zachery Dakins, RN Outcome: Progressing Goal: Will not experience complications related to urinary retention 09/24/2020 1225 by Zachery Dakins, RN Outcome: Adequate for Discharge 09/24/2020 1015 by Zachery Dakins, RN Outcome: Progressing   Problem: Pain Managment: Goal: General experience of comfort will improve 09/24/2020 1225 by Zachery Dakins, RN Outcome: Adequate for Discharge 09/24/2020 1015 by Zachery Dakins, RN Outcome: Progressing   Problem: Safety: Goal: Ability to remain free from injury will improve 09/24/2020 1225 by Zachery Dakins, RN Outcome: Adequate for Discharge 09/24/2020 1015 by Zachery Dakins, RN Outcome: Progressing   Problem: Skin Integrity: Goal: Risk for impaired skin integrity will decrease 09/24/2020 1225 by Zachery Dakins, RN Outcome: Adequate for Discharge 09/24/2020 1015 by Zachery Dakins, RN Outcome: Progressing   Problem: Education: Goal: Knowledge of General Education information will improve Description: Including pain rating scale, medication(s)/side effects and non-pharmacologic comfort measures 09/24/2020 1225 by Zachery Dakins, RN Outcome: Adequate for Discharge 09/24/2020 1015 by Zachery Dakins, RN Outcome: Progressing   Problem: Health Behavior/Discharge Planning: Goal: Ability to manage health-related needs will improve 09/24/2020 1225 by Zachery Dakins, RN Outcome: Adequate for Discharge 09/24/2020 1015 by  Zachery Dakins, RN Outcome: Progressing   Problem: Clinical Measurements: Goal: Ability to maintain clinical measurements within normal limits will improve 09/24/2020 1225 by Zachery Dakins, RN Outcome: Adequate for Discharge 09/24/2020 1015 by Zachery Dakins, RN Outcome: Progressing Goal: Will remain free from infection 09/24/2020 1225 by Zachery Dakins, RN Outcome: Adequate for Discharge 09/24/2020 1015 by  Zachery Dakins, RN Outcome: Progressing Goal: Diagnostic test results will improve 09/24/2020 1225 by Zachery Dakins, RN Outcome: Adequate for Discharge 09/24/2020 1015 by Zachery Dakins, RN Outcome: Progressing Goal: Respiratory complications will improve 09/24/2020 1225 by Zachery Dakins, RN Outcome: Adequate for Discharge 09/24/2020 1015 by Zachery Dakins, RN Outcome: Progressing Goal: Cardiovascular complication will be avoided 09/24/2020 1225 by Zachery Dakins, RN Outcome: Adequate for Discharge 09/24/2020 1015 by Zachery Dakins, RN Outcome: Progressing   Problem: Activity: Goal: Risk for activity intolerance will decrease 09/24/2020 1225 by Zachery Dakins, RN Outcome: Adequate for Discharge 09/24/2020 1015 by Zachery Dakins, RN Outcome: Progressing   Problem: Nutrition: Goal: Adequate nutrition will be maintained 09/24/2020 1225 by Zachery Dakins, RN Outcome: Adequate for Discharge 09/24/2020 1015 by Zachery Dakins, RN Outcome: Progressing   Problem: Coping: Goal: Level of anxiety will decrease 09/24/2020 1225 by Zachery Dakins, RN Outcome: Adequate for Discharge 09/24/2020 1015 by Zachery Dakins, RN Outcome: Progressing   Problem: Elimination: Goal: Will not experience complications related to bowel motility 09/24/2020 1225 by Zachery Dakins, RN Outcome: Adequate for Discharge 09/24/2020 1015 by Zachery Dakins, RN Outcome: Progressing Goal: Will not  experience complications related to urinary retention 09/24/2020 1225 by Zachery Dakins, RN Outcome: Adequate for Discharge 09/24/2020 1015 by Zachery Dakins, RN Outcome: Progressing   Problem: Pain Managment: Goal: General experience of comfort will improve 09/24/2020 1225 by Zachery Dakins, RN Outcome: Adequate for Discharge 09/24/2020 1015 by Zachery Dakins, RN Outcome: Progressing   Problem: Safety: Goal: Ability to remain free from injury will improve 09/24/2020 1225 by Zachery Dakins, RN Outcome: Adequate for Discharge 09/24/2020 1015 by Zachery Dakins, RN Outcome: Progressing   Problem: Skin Integrity: Goal: Risk for impaired skin integrity will decrease 09/24/2020 1225 by Zachery Dakins, RN Outcome: Adequate for Discharge 09/24/2020 1015 by Zachery Dakins, RN Outcome: Progressing   Problem: Education: Goal: Knowledge of risk factors and measures for prevention of condition will improve 09/24/2020 1225 by Zachery Dakins, RN Outcome: Adequate for Discharge 09/24/2020 1015 by Zachery Dakins, RN Outcome: Progressing   Problem: Coping: Goal: Psychosocial and spiritual needs will be supported 09/24/2020 1225 by Zachery Dakins, RN Outcome: Adequate for Discharge 09/24/2020 1015 by Zachery Dakins, RN Outcome: Progressing   Problem: Respiratory: Goal: Will maintain a patent airway 09/24/2020 1225 by Zachery Dakins, RN Outcome: Adequate for Discharge 09/24/2020 1015 by Zachery Dakins, RN Outcome: Progressing Goal: Complications related to the disease process, condition or treatment will be avoided or minimized 09/24/2020 1225 by Zachery Dakins, RN Outcome: Adequate for Discharge 09/24/2020 1015 by Zachery Dakins, RN Outcome: Progressing

## 2020-09-24 NOTE — TOC Initial Note (Signed)
Transition of Care [facility]) - Initial/Assessment Note    Patient Details  Name: Sarah Choi MRN: [id number] Date of Birth: 11-05-1955  Transition of Care [facility]) CM/SW Contact:    Annice Needy, LCSW Phone Number: 09/24/2020, 11:48 AM  Clinical Narrative:                 Patient in need of home oxygen. DME suppliers discussed. Referral made to [employer].   Expected Discharge Plan: Home/Self Care Barriers to Discharge: No Barriers Identified   Patient Goals and CMS Choice        Expected Discharge Plan and Services Expected Discharge Plan: Home/Self Care     Post Acute Care Choice: Durable Medical Equipment Living arrangements for the past 2 months: Single Family Home                 DME Arranged: Oxygen DME Agency: [employer] Date DME Agency Contacted: 09/24/20 Time DME Agency Contacted: 1147 Representative spoke with at DME Agency: [facility]            Prior Living Arrangements/Services Living arrangements for the past 2 months: Single Family Home Lives with:: Significant Other Patient language and need for interpreter reviewed:: Yes Do you feel safe going back to the place where you live?: Yes      Need for Family Participation in Patient Care: Yes (Comment) Care giver support system in place?: Yes (comment)   Criminal Activity/Legal Involvement Pertinent to Current Situation/Hospitalization: No - Comment as needed  Activities of Daily Living Home Assistive Devices/Equipment: None ADL Screening (condition at time of admission) Patient's cognitive ability adequate to safely complete daily activities?: Yes Is the patient deaf or have difficulty hearing?: No Does the patient have difficulty seeing, even when wearing glasses/contacts?: No Does the patient have difficulty concentrating, remembering, or making decisions?: No Patient able to express need for assistance with ADLs?: Yes Does the patient have difficulty dressing or bathing?: No Independently performs  ADLs?: Yes (appropriate for developmental age) Does the patient have difficulty walking or climbing stairs?: No Weakness of Legs: None Weakness of Arms/Hands: None  Permission Sought/Granted                  Emotional Assessment     Affect (typically observed): Appropriate Orientation: : Oriented to Self,Oriented to Place,Oriented to  Time,Oriented to Situation Alcohol / Substance Use: Not Applicable Psych Involvement: No (comment)  Admission diagnosis:  Hypoxia [R09.02] Acute hypoxemic respiratory failure due to COVID-19 (HCC) [U07.1, J96.01] COVID-19 virus infection [U07.1] Patient Active Problem List   Diagnosis Date Noted   Acute hypoxemic respiratory failure due to COVID-19 [facility]) 09/09/2020   PCP:  [facility]:   [facility] [place], [place] - [address] [place] [address] Phone: [phone] Fax: [phone]     Social Determinants of Health (SDOH) Interventions    Readmission Risk Interventions No flowsheet data found.

## 2020-09-24 NOTE — Progress Notes (Signed)
Portable O2 tanks delivered by [employer].

## 2020-09-24 NOTE — Discharge Summary (Signed)
Physician Discharge Summary  Sarah Choi YYT:[id number] DOB: 15-Jul-1956 DOA: 09/09/2020  PCP: [facility]  Admit date: 09/09/2020 Discharge date: 09/24/2020  Time spent: 35 minutes  Recommendations for Outpatient Follow-up:  1. Repeat BMET to follow electrolytes and renal function 2. Repeat CXR in 6 weeks to assure resolution of infiltrates.   Discharge Diagnoses:  COVID-19 virus PNA Acute resp failure with Hypoxia COPD Hypothyroidism   Discharge Condition: stable and improved. Discharge with instructions to follow up with PCP in 2 weeks. Home oxygen arranged.  Code status: full code  Diet recommendation: regular diet.  Filed Weights   09/09/20 1544  Weight: 72.6 kg    History of present illness:   Sarah Rickmanis a 65 y.o.femalewith PMH significant for hypothyroidism, COPD, unvaccinated to Covid. Patient presented to the ED on 1/6 with complaint of shortness of breath and cough that began 10 days ago.  In the ED, oxygen saturation noted to be 80% on room air, required 4 L by nasal cannula to maintain more than 90%. Covid PCR positive. Procalcitonin negative, CRP elevated Chest x-ray showed scattered interstitial infiltrates throughout both lungs Patient was admitted for respiratory failure secondary to Covid related pneumonia.She was started on remdesivir, IV steroids and baricitinib.  Hospital Course:  COVID pneumonia Acute respiratory failure with hypoxia  -Presented with progressively worsening cough, shortness of breath. -COVID test: PCR positive on admission -Chestxray: demonstrated Multifocal pneumonia -PCT <0.10; no antibiotics give. -Patient completed 5 days of remdesivir on 1/10 and also 14 days of Barcitinib on 09/23/20 -discharged home with steroids tapering -will also continue IS and flutter valve; PRN antitussive/mucolytics and as needed bronchodilators.  -Currently on 4 liters at rest and on 6 L with ambulation. -continue vit C and  zinc. -one more week of isolation to complete 21 days. -09/16/20--CTA chest--neg for PE;Diffuse peripheral predominant ground-glass opacities throughout both lungs typical of COVID pneumonia -advised to maintain adequate hydration.   Hypothyroidism -Continue Synthroid at current dose.  COPD -On steroids and bronchodilators as stated above. -approx 30 pack years history -No wheezing currently  Procedures: See below for x-ray reports.   Consultations:  None   Discharge Exam: Vitals:   09/24/20 0459 09/24/20 0828  BP: 114/66   Pulse: 63   Resp: 20   Temp: 97.7 F (36.5 C)   SpO2: 91% 90%    General: afebrile, no CP, no nausea, no vomiting. Patient reports winded sensation with activity and dry intermittent coughing spells. Good saturation appreciated with use of 4-^ L Sarah Choi supplementation. Cardiovascular: rate controlled, no rubs, no gallops, no murmur. Respiratory: improved air movement, positive rhonchi; no wheezing, no crackles. Extremities: no edema, no cyanosis.  Discharge Instructions   Discharge Instructions    Discharge instructions   Complete by: As directed    Take medications as prescribed Maintain adequate hydration Arranged follow up with PCP in 2 weeks Please use 4L oxygen at rest and 6L on exertion. Continue to follow the 3W's: Wash hands frequently, Wait 6 feet apart (social distancing) and Wear mask. Isolation for 1 more week recommended.   Increase activity slowly   Complete by: As directed      Allergies as of 09/24/2020      Reactions   Codeine Other (See Comments)   Other reaction(s): Headache      Medication List    STOP taking these medications   azithromycin 250 MG tablet Commonly known as: ZITHROMAX     TAKE these medications   albuterol 108 (90 Base) MCG/ACT  inhaler Commonly known as: VENTOLIN HFA Inhale 2 puffs into the lungs every 6 (six) hours as needed for wheezing or shortness of breath.   ascorbic acid 500 MG  tablet Commonly known as: VITAMIN C Take 1 tablet (500 mg total) by mouth 2 (two) times daily.   guaiFENesin-dextromethorphan 100-10 MG/5ML syrup Commonly known as: ROBITUSSIN DM Take 5 mLs by mouth every 4 (four) hours as needed for cough.   levothyroxine 112 MCG tablet Commonly known as: SYNTHROID Take 112 mcg by mouth daily.   nystatin 100000 UNIT/ML suspension Commonly known as: MYCOSTATIN Take 5 mLs (500,000 Units total) by mouth 4 (four) times daily for 5 days.   pantoprazole 40 MG tablet Commonly known as: PROTONIX Take 1 tablet (40 mg total) by mouth daily. Start taking on: September 25, 2020   predniSONE 20 MG tablet Commonly known as: DELTASONE Take 3 tabs by mouth daily X 2 days; then 2 tabs by mouth daily X 3 days; then 1 tab my mouth daily X 3 days; then 1/2 tablet by mouth daily X 3 days and stop prednisone. What changed:   medication strength  how much to take  how to take this  when to take this  additional instructions   Trelegy Ellipta 100-62.5-25 MCG/INH Aepb Generic drug: Fluticasone-Umeclidin-Vilant Inhale 1 puff into the lungs daily.   zinc sulfate 220 (50 Zn) MG capsule Take 1 capsule (220 mg total) by mouth daily. Start taking on: September 25, 2020            Durable Medical Equipment  (From admission, onward)         Start     Ordered   09/24/20 1021  For home use only DME oxygen  Once       Question Answer Comment  Length of Need 12 Months   Mode or (Route) Nasal cannula   Liters per Minute 6   Frequency Continuous (stationary and portable oxygen unit needed)   Oxygen conserving device Yes   Oxygen delivery system Gas      09/24/20 1020   09/23/20 1226  For home use only DME Walker rolling  Once       Question Answer Comment  Walker: With 5 Inch Wheels   Patient needs a walker to treat with the following condition Physical deconditioning      09/23/20 1226         Allergies  Allergen Reactions  . Codeine Other (See  Comments)    Other reaction(s): Headache    Follow-up Information    [facility]. Schedule an appointment as soon as possible for a visit in 2 week(s).   Specialty: Internal Medicine Contact information: [address] [place] [address] [phone]               The results of significant diagnostics from this hospitalization (including imaging, microbiology, ancillary and laboratory) are listed below for reference.    Significant Diagnostic Studies: CT ANGIO CHEST PE W OR WO CONTRAST  Result Date: 09/16/2020 CLINICAL DATA:  PE suspected, low/intermediate prob, positive D-dimer COVID 19, sob, hypoxia EXAM: CT ANGIOGRAPHY CHEST WITH CONTRAST TECHNIQUE: Multidetector CT imaging of the chest was performed using the standard protocol during bolus administration of intravenous contrast. Multiplanar CT image reconstructions and MIPs were obtained to evaluate the vascular anatomy. CONTRAST:  66mL OMNIPAQUE IOHEXOL 350 MG/ML SOLN COMPARISON:  Radiograph 09/09/2020 FINDINGS: Cardiovascular: There are no filling defects within the pulmonary arteries to suggest pulmonary embolus. Mild aortic  atherosclerosis without dissection or acute aortic findings. No aortic aneurysm. Common origin of the brachiocephalic and left common carotid artery, left vertebral artery arises directly from the thoracic aorta, variant arch anatomy. Heart is normal in size. No pericardial effusion. Mediastinum/Nodes: Enlarged mediastinal or hilar lymph nodes. No axillary adenopathy. Punctate right thyroid calcification needs no further follow-up. Tiny hiatal hernia. No esophageal wall thickening. No pneumomediastinum. Lungs/Pleura: Diffuse peripheral predominant ground-glass opacities throughout both lungs typical of COVID pneumonia. There is scattered central bronchial thickening. Mild apical predominant emphysema. No pneumothorax or pleural fluid. Upper Abdomen: No acute or unexpected findings. Musculoskeletal: There  are no acute or suspicious osseous abnormalities. Review of the MIP images confirms the above findings. IMPRESSION: 1. No pulmonary embolus. 2. Diffuse peripheral predominant ground-glass opacities throughout both lungs typical of COVID pneumonia. Aortic Atherosclerosis (ICD10-I70.0) and Emphysema (ICD10-J43.9). Electronically Signed   By: Narda Rutherford M.D.   On: 09/16/2020 20:11   DG Chest Port 1 View  Result Date: 09/09/2020 CLINICAL DATA:  Shortness of breath and cough beginning 10 days ago, history of COPD, former smoker EXAM: PORTABLE CHEST 1 VIEW COMPARISON:  Portable exam 1628 hours without priors available for comparison FINDINGS: Normal heart size, mediastinal contours, and pulmonary vascularity. Scattered infiltrates throughout both lungs, could represent pulmonary edema or atypical infection. Minimal central peribronchial thickening. No pleural effusion or pneumothorax. Osseous structures unremarkable. IMPRESSION: Scattered interstitial infiltrates throughout both lungs, question pulmonary edema versus atypical infection. Electronically Signed   By: Ulyses Southward M.D.   On: 09/09/2020 16:37    Microbiology: No results found for this or any previous visit (from the past 240 hour(s)).   Labs: Basic Metabolic Panel: Recent Labs  Lab 09/18/20 0638 09/20/20 0500 09/21/20 0508  NA 137 140 142  K 3.6 3.9 3.5  CL 105 109 105  CO2 23 24 29   GLUCOSE 144* 116* 126*  BUN 25* 29* 32*  CREATININE 0.64 0.63 0.74  CALCIUM 8.6* 8.3* 8.8*  MG 2.3 2.4  --   PHOS 3.7  --   --    Liver Function Tests: Recent Labs  Lab 09/18/20 0638 09/20/20 0500 09/21/20 0508  AST 11* 12* 13*  ALT 24 24 27   ALKPHOS 69 57 65  BILITOT 0.5 0.7 0.5  PROT 6.4* 5.7* 6.3*  ALBUMIN 3.0* 2.8* 3.1*   CBC: Recent Labs  Lab 09/18/20 0638 09/20/20 0500 09/21/20 0508  WBC 12.5* 12.4* 10.2  HGB 12.1 11.0* 12.5  HCT 37.5 33.3* 38.3  MCV 99.5 98.5 99.2  PLT 437* 395 370   BNP (last 3 results) Recent Labs     09/17/20 0420  BNP 57.0    Signed:  09/23/20 MD.  [facility] 09/24/2020, 12:05 PM

## 2020-09-24 NOTE — Plan of Care (Signed)
  Problem: Education: Goal: Knowledge of General Education information will improve Description: Including pain rating scale, medication(s)/side effects and non-pharmacologic comfort measures Outcome: Progressing   Problem: Health Behavior/Discharge Planning: Goal: Ability to manage health-related needs will improve Outcome: Progressing   Problem: Clinical Measurements: Goal: Ability to maintain clinical measurements within normal limits will improve Outcome: Progressing Goal: Will remain free from infection Outcome: Progressing Goal: Diagnostic test results will improve Outcome: Progressing Goal: Respiratory complications will improve Outcome: Progressing Goal: Cardiovascular complication will be avoided Outcome: Progressing   Problem: Activity: Goal: Risk for activity intolerance will decrease Outcome: Progressing   Problem: Nutrition: Goal: Adequate nutrition will be maintained Outcome: Progressing   Problem: Coping: Goal: Level of anxiety will decrease Outcome: Progressing   Problem: Elimination: Goal: Will not experience complications related to bowel motility Outcome: Progressing Goal: Will not experience complications related to urinary retention Outcome: Progressing   Problem: Pain Managment: Goal: General experience of comfort will improve Outcome: Progressing   Problem: Safety: Goal: Ability to remain free from injury will improve Outcome: Progressing   Problem: Skin Integrity: Goal: Risk for impaired skin integrity will decrease Outcome: Progressing   Problem: Education: Goal: Knowledge of General Education information will improve Description: Including pain rating scale, medication(s)/side effects and non-pharmacologic comfort measures Outcome: Progressing   Problem: Health Behavior/Discharge Planning: Goal: Ability to manage health-related needs will improve Outcome: Progressing   Problem: Clinical Measurements: Goal: Ability to maintain  clinical measurements within normal limits will improve Outcome: Progressing Goal: Will remain free from infection Outcome: Progressing Goal: Diagnostic test results will improve Outcome: Progressing Goal: Respiratory complications will improve Outcome: Progressing Goal: Cardiovascular complication will be avoided Outcome: Progressing   Problem: Activity: Goal: Risk for activity intolerance will decrease Outcome: Progressing   Problem: Nutrition: Goal: Adequate nutrition will be maintained Outcome: Progressing   Problem: Coping: Goal: Level of anxiety will decrease Outcome: Progressing   Problem: Elimination: Goal: Will not experience complications related to bowel motility Outcome: Progressing Goal: Will not experience complications related to urinary retention Outcome: Progressing   Problem: Pain Managment: Goal: General experience of comfort will improve Outcome: Progressing   Problem: Safety: Goal: Ability to remain free from injury will improve Outcome: Progressing   Problem: Skin Integrity: Goal: Risk for impaired skin integrity will decrease Outcome: Progressing   Problem: Education: Goal: Knowledge of risk factors and measures for prevention of condition will improve Outcome: Progressing   Problem: Coping: Goal: Psychosocial and spiritual needs will be supported Outcome: Progressing   Problem: Respiratory: Goal: Will maintain a patent airway Outcome: Progressing Goal: Complications related to the disease process, condition or treatment will be avoided or minimized Outcome: Progressing   

## 2021-08-23 DIAGNOSIS — Z79899 Other long term (current) drug therapy: Secondary | ICD-10-CM | POA: Diagnosis not present

## 2021-08-23 DIAGNOSIS — Z7189 Other specified counseling: Secondary | ICD-10-CM | POA: Diagnosis not present

## 2021-08-23 DIAGNOSIS — Z299 Encounter for prophylactic measures, unspecified: Secondary | ICD-10-CM | POA: Diagnosis not present

## 2021-08-23 DIAGNOSIS — I10 Essential (primary) hypertension: Secondary | ICD-10-CM | POA: Diagnosis not present

## 2021-08-23 DIAGNOSIS — E7800 Pure hypercholesterolemia, unspecified: Secondary | ICD-10-CM | POA: Diagnosis not present

## 2021-08-23 DIAGNOSIS — R5383 Other fatigue: Secondary | ICD-10-CM | POA: Diagnosis not present

## 2021-08-23 DIAGNOSIS — Z0000 Encounter for general adult medical examination without abnormal findings: Secondary | ICD-10-CM | POA: Diagnosis not present

## 2021-11-09 DIAGNOSIS — Z299 Encounter for prophylactic measures, unspecified: Secondary | ICD-10-CM | POA: Diagnosis not present

## 2021-11-09 DIAGNOSIS — I10 Essential (primary) hypertension: Secondary | ICD-10-CM | POA: Diagnosis not present

## 2021-11-09 DIAGNOSIS — D692 Other nonthrombocytopenic purpura: Secondary | ICD-10-CM | POA: Diagnosis not present

## 2021-11-09 DIAGNOSIS — J449 Chronic obstructive pulmonary disease, unspecified: Secondary | ICD-10-CM | POA: Diagnosis not present

## 2021-11-09 DIAGNOSIS — J9611 Chronic respiratory failure with hypoxia: Secondary | ICD-10-CM | POA: Diagnosis not present

## 2021-11-16 DIAGNOSIS — Z299 Encounter for prophylactic measures, unspecified: Secondary | ICD-10-CM | POA: Diagnosis not present

## 2021-11-16 DIAGNOSIS — R059 Cough, unspecified: Secondary | ICD-10-CM | POA: Diagnosis not present

## 2021-11-16 DIAGNOSIS — J449 Chronic obstructive pulmonary disease, unspecified: Secondary | ICD-10-CM | POA: Diagnosis not present

## 2022-01-09 DIAGNOSIS — Z1231 Encounter for screening mammogram for malignant neoplasm of breast: Secondary | ICD-10-CM | POA: Diagnosis not present

## 2022-03-02 DIAGNOSIS — J449 Chronic obstructive pulmonary disease, unspecified: Secondary | ICD-10-CM | POA: Diagnosis not present

## 2022-03-02 DIAGNOSIS — I10 Essential (primary) hypertension: Secondary | ICD-10-CM | POA: Diagnosis not present

## 2022-03-02 DIAGNOSIS — R42 Dizziness and giddiness: Secondary | ICD-10-CM | POA: Diagnosis not present

## 2022-03-02 DIAGNOSIS — J9611 Chronic respiratory failure with hypoxia: Secondary | ICD-10-CM | POA: Diagnosis not present

## 2022-03-02 DIAGNOSIS — Z299 Encounter for prophylactic measures, unspecified: Secondary | ICD-10-CM | POA: Diagnosis not present

## 2022-03-10 DIAGNOSIS — H40033 Anatomical narrow angle, bilateral: Secondary | ICD-10-CM | POA: Diagnosis not present

## 2022-03-10 DIAGNOSIS — H2513 Age-related nuclear cataract, bilateral: Secondary | ICD-10-CM | POA: Diagnosis not present

## 2022-03-10 IMAGING — DX DG CHEST 1V PORT
1 series · 1 of 1 positions shown · non-contrast
Comparison: Portable exam 1689 hours without priors available for
comparison

CLINICAL DATA: Shortness of breath and cough beginning 10 days ago,
history of COPD, former smoker

EXAM:
PORTABLE CHEST 1 VIEW

[chest ap]
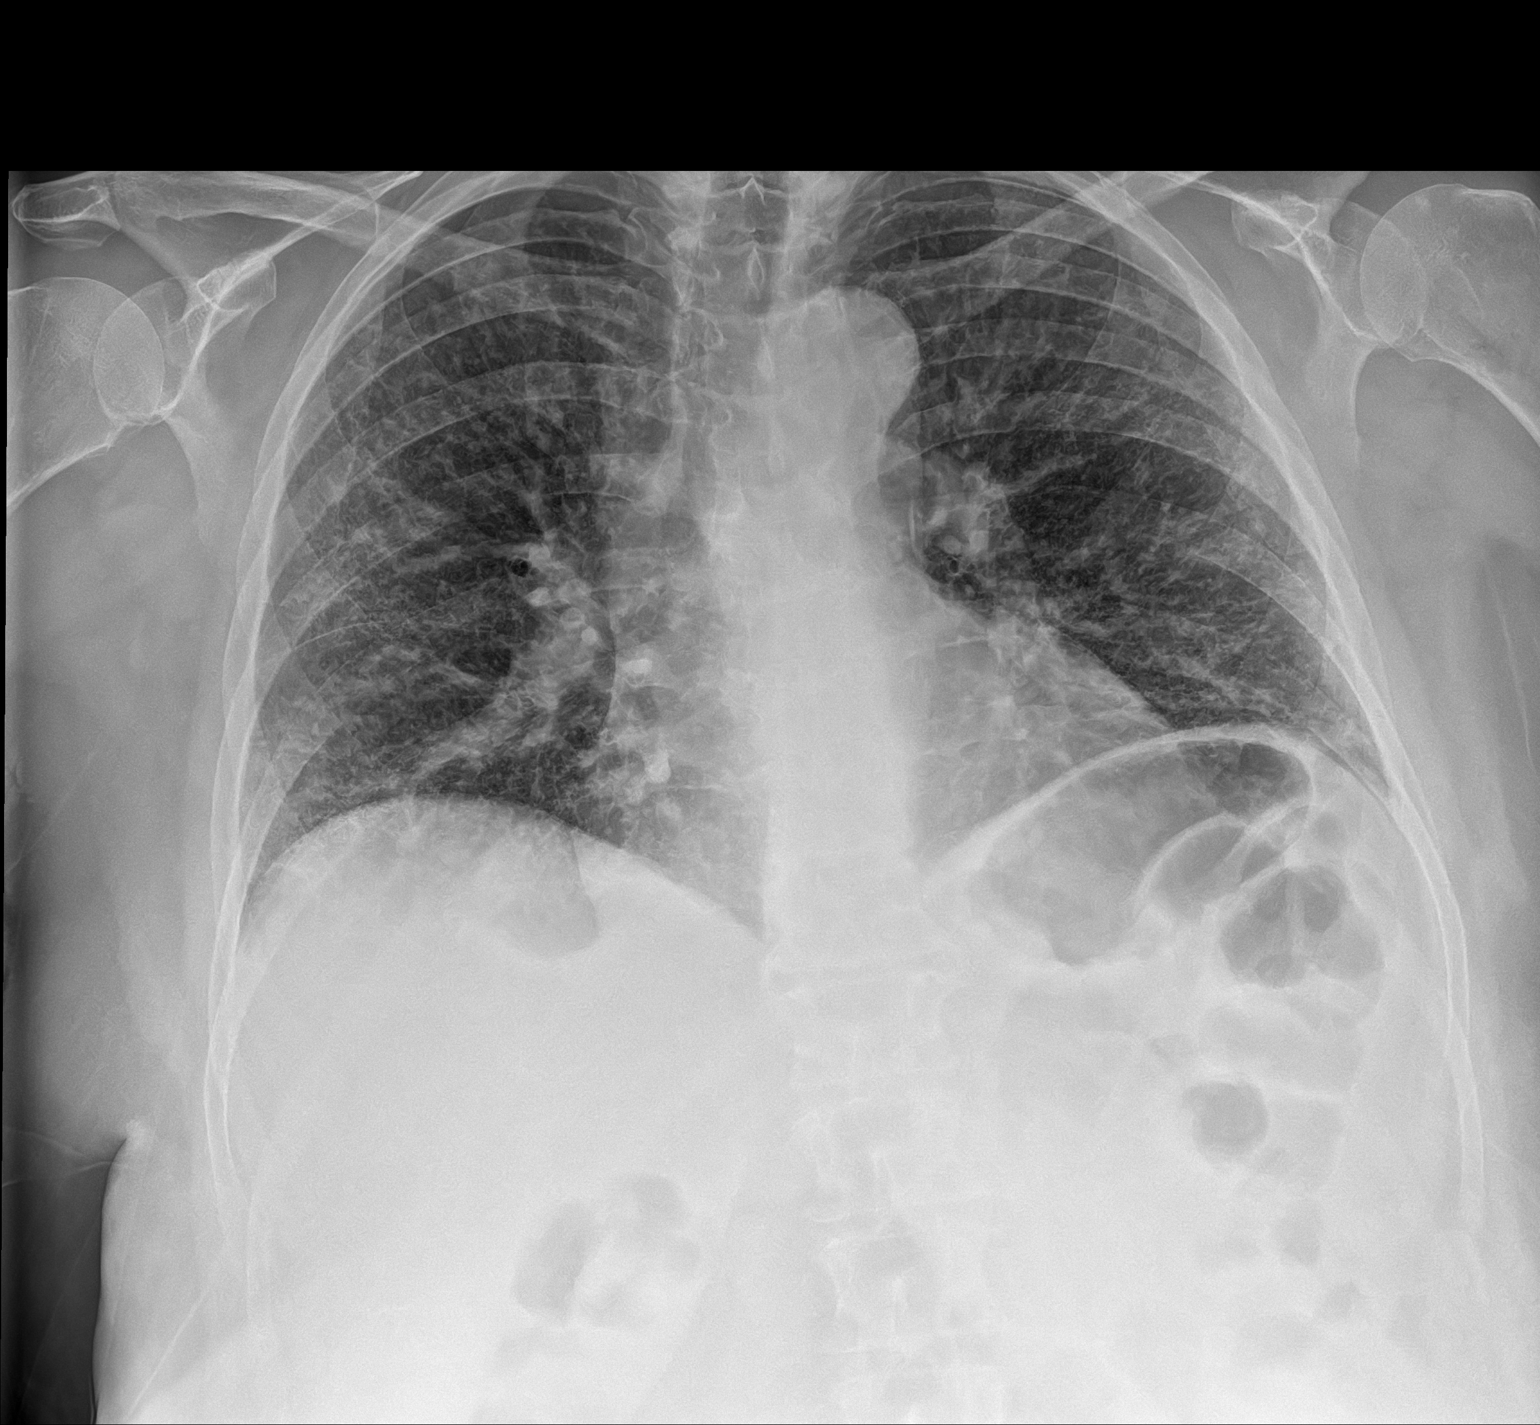

[1 of 1 positions shown; findings below may reference images not displayed]

FINDINGS: Normal heart size, mediastinal contours, and pulmonary vascularity.

Scattered infiltrates throughout both lungs, could represent
pulmonary edema or atypical infection.

Minimal central peribronchial thickening.

No pleural effusion or pneumothorax.

Osseous structures unremarkable.
IMPRESSION: Scattered interstitial infiltrates throughout both lungs, question
pulmonary edema versus atypical infection.

## 2022-03-14 DIAGNOSIS — Z713 Dietary counseling and surveillance: Secondary | ICD-10-CM | POA: Diagnosis not present

## 2022-03-14 DIAGNOSIS — Z299 Encounter for prophylactic measures, unspecified: Secondary | ICD-10-CM | POA: Diagnosis not present

## 2022-03-14 DIAGNOSIS — R42 Dizziness and giddiness: Secondary | ICD-10-CM | POA: Diagnosis not present

## 2022-03-14 DIAGNOSIS — I10 Essential (primary) hypertension: Secondary | ICD-10-CM | POA: Diagnosis not present

## 2022-03-17 IMAGING — CT CT ANGIO CHEST
2 of 6 series · 17 of 46 positions shown · IV contrast (omnipaque)
Comparison: Radiograph 09/09/2020

CLINICAL DATA: PE suspected, low/intermediate prob, positive
D-dimer COVID 19, sob, hypoxia

EXAM:
CT ANGIOGRAPHY CHEST WITH CONTRAST
TECHNIQUE: Multidetector CT imaging of the chest was performed using the
standard protocol during bolus administration of intravenous
contrast. Multiplanar CT image reconstructions and MIPs were
obtained to evaluate the vascular anatomy.
CONTRAST:  75mL OMNIPAQUE IOHEXOL 350 MG/ML SOLN

[Series 5: pe axial thins · axial · 0.74mm/px · z∈[+1101,+1301]mm · 14 of 220 slices shown]
[im 10/220  lung]
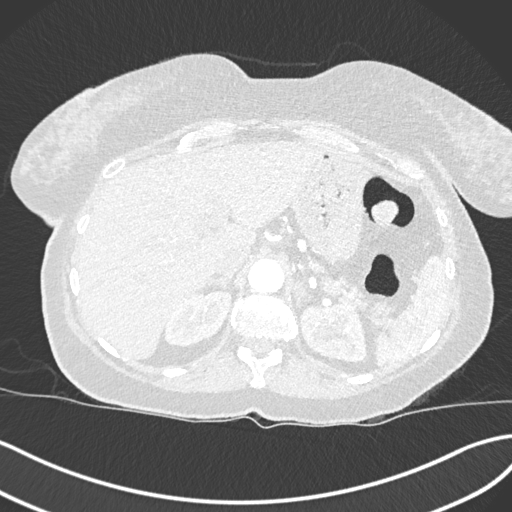
[im 29/220  soft-tissue]
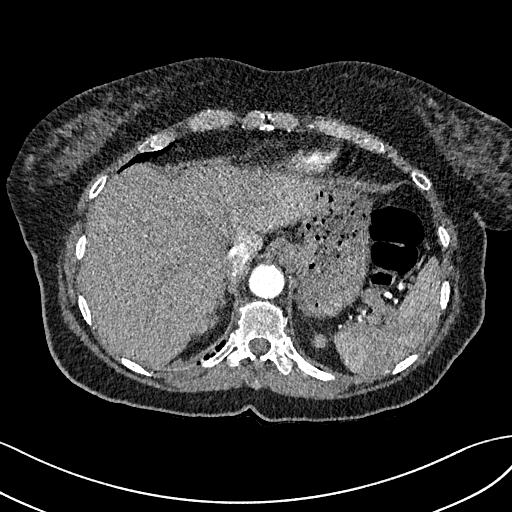
[im 39/220  lung]
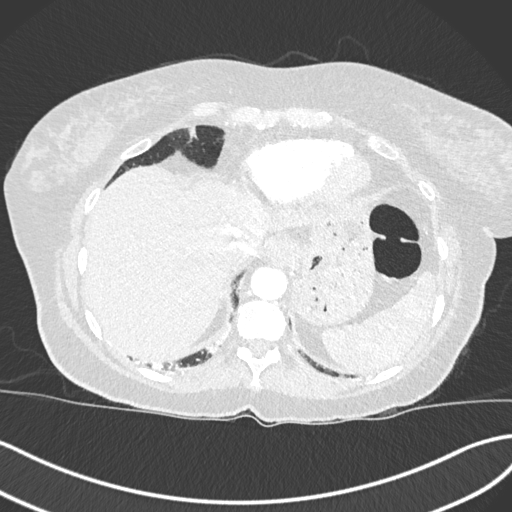
[im 58/220  soft-tissue]
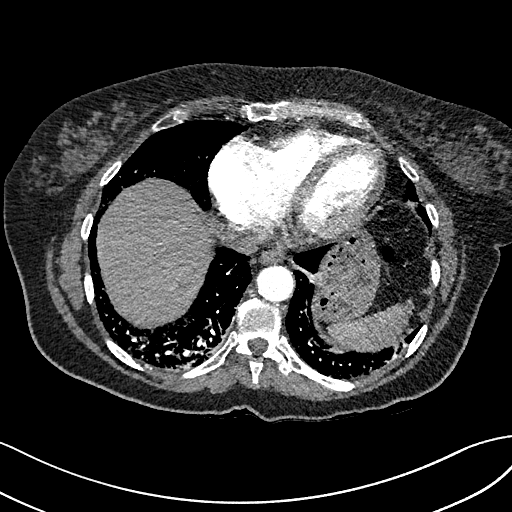
[im 77/220  lung]
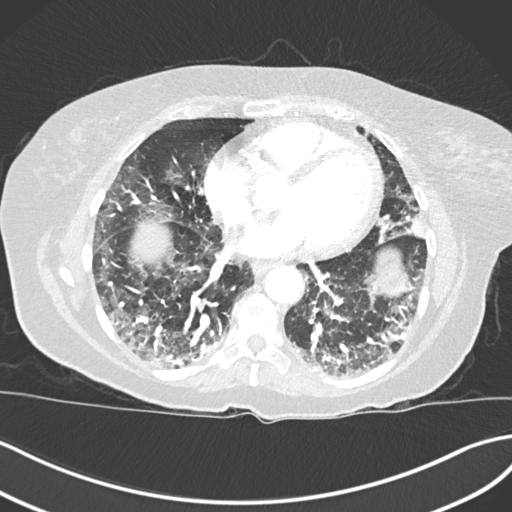
[im 86/220  soft-tissue]
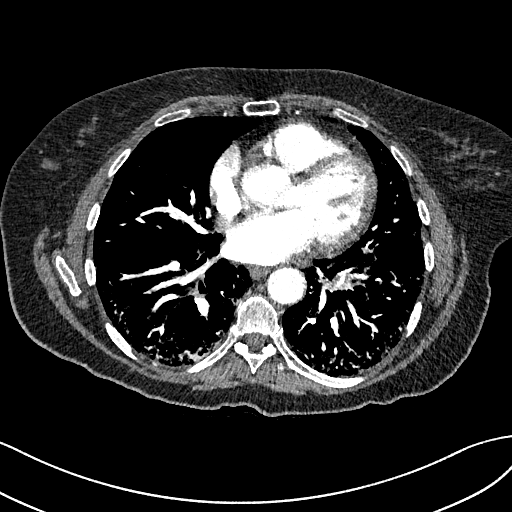
[im 105/220  lung]
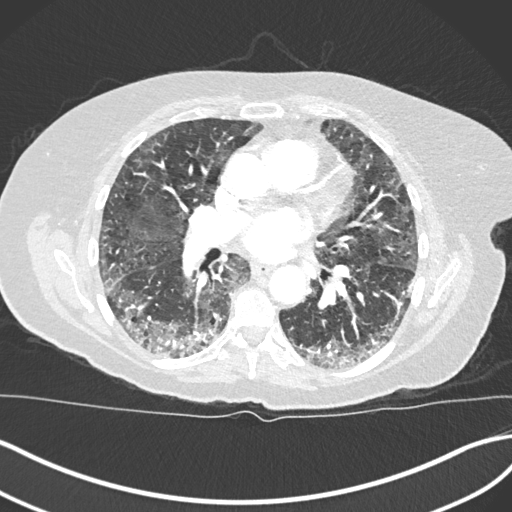
[im 115/220  soft-tissue]
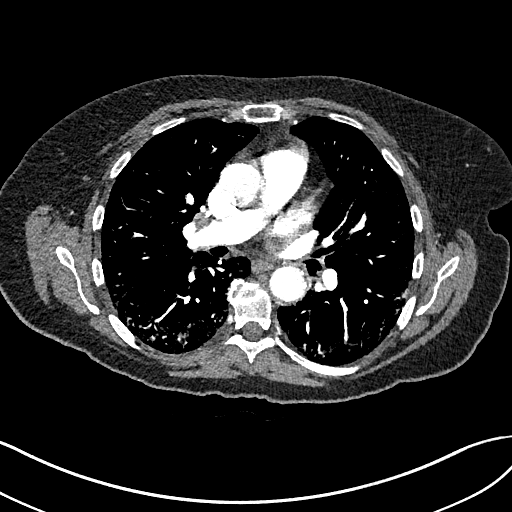
[im 134/220  lung]
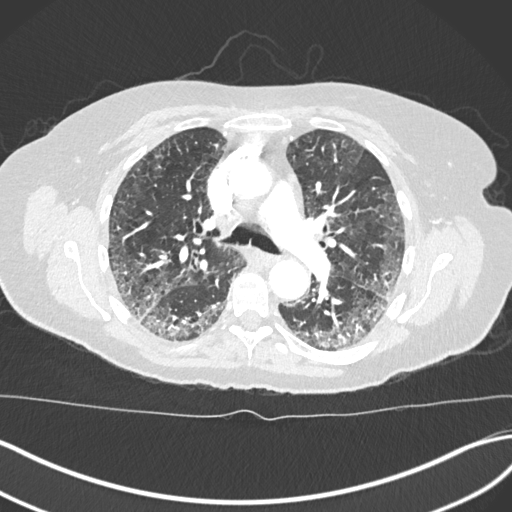
[im 143/220  soft-tissue]
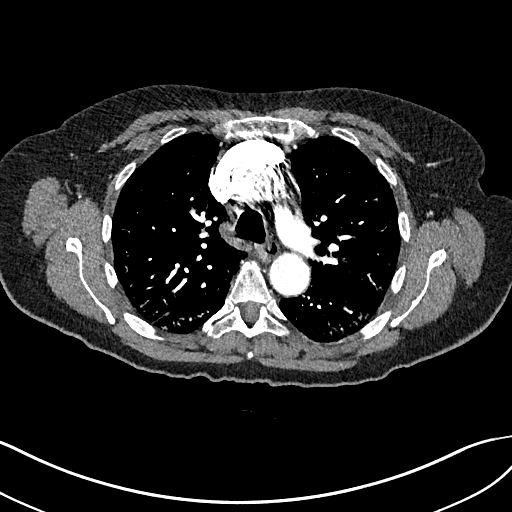
[im 162/220  lung]
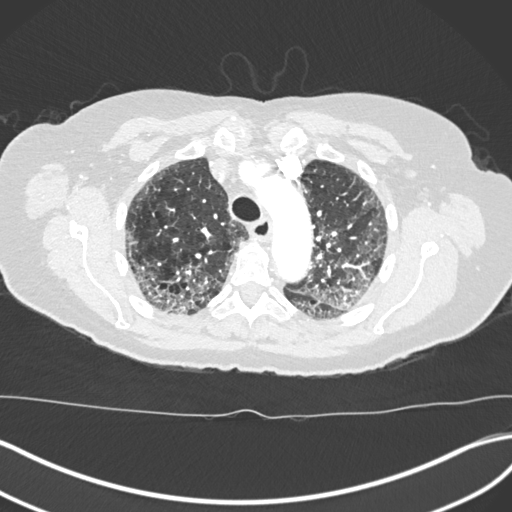
[im 181/220  soft-tissue]
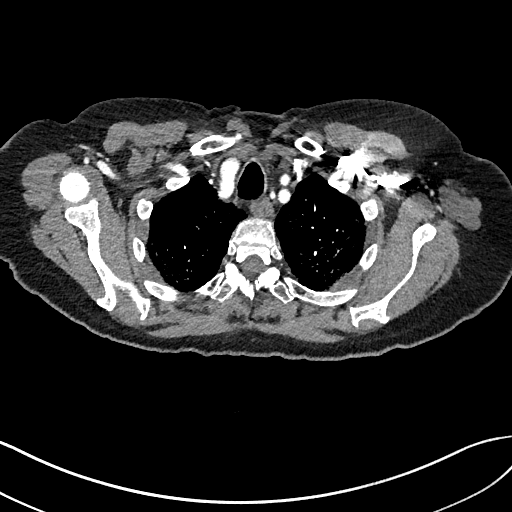
[im 191/220  lung]
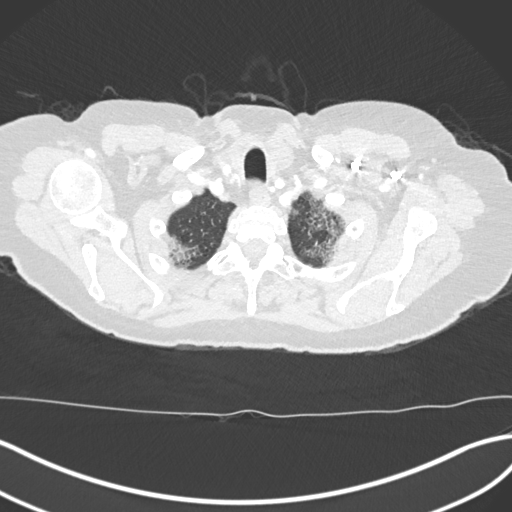
[im 210/220  soft-tissue]
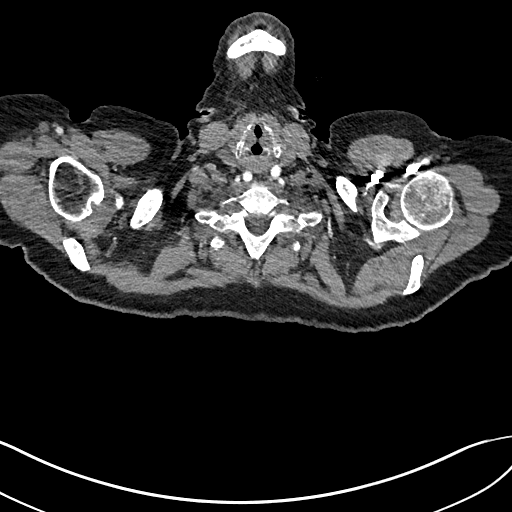

[Series 7: cor soft · coronal · 0.45mm/px · 3 of 151 slices shown]
[im 38/151  soft-tissue]
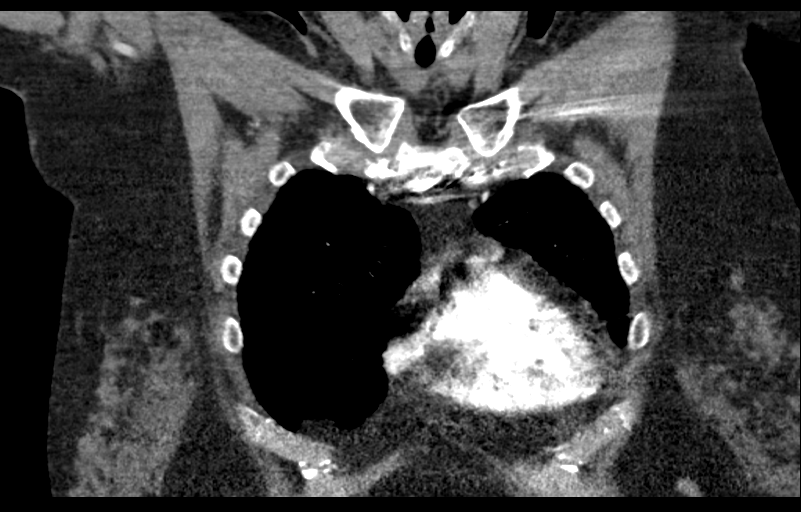
[im 76/151  soft-tissue]
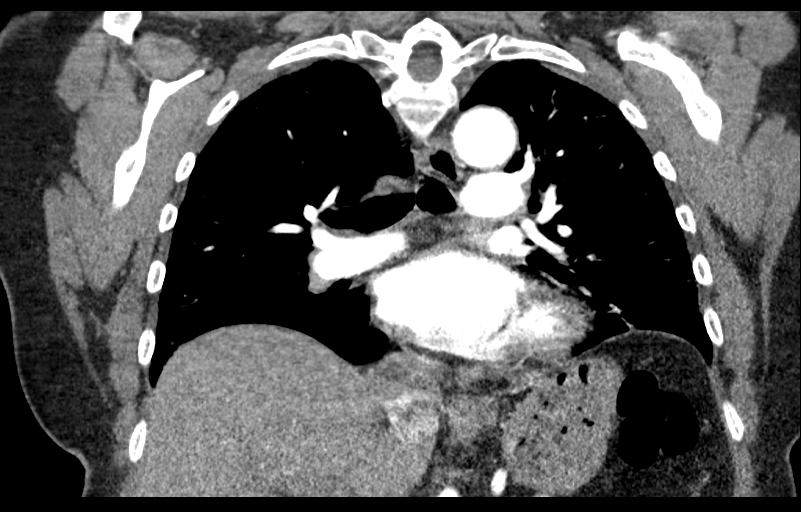
[im 113/151  soft-tissue]
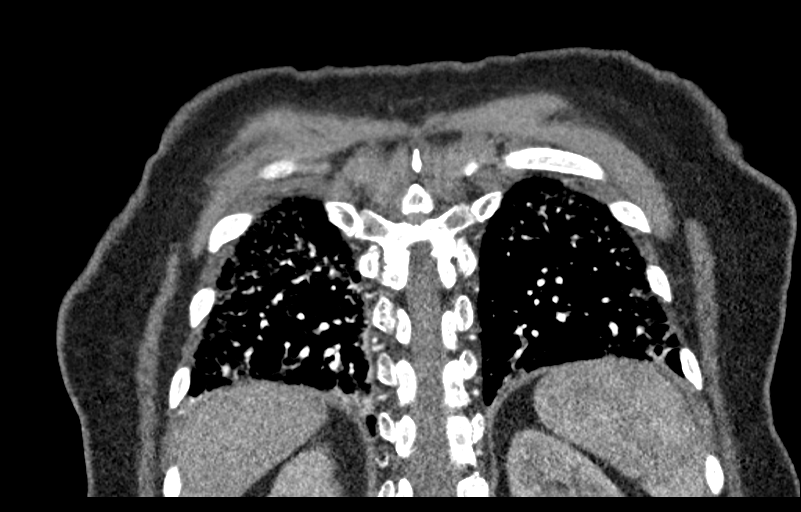

[17 of 46 positions shown; findings below may reference images not displayed]

FINDINGS: Cardiovascular: There are no filling defects within the pulmonary
arteries to suggest pulmonary embolus. Mild aortic atherosclerosis
without dissection or acute aortic findings. No aortic aneurysm.
Common origin of the brachiocephalic and left common carotid artery,
left vertebral artery arises directly from the thoracic aorta,
variant arch anatomy. Heart is normal in size. No pericardial
effusion.

Mediastinum/Nodes: Enlarged mediastinal or hilar lymph nodes. No
axillary adenopathy. Punctate right thyroid calcification needs no
further follow-up. Tiny hiatal hernia. No esophageal wall
thickening. No pneumomediastinum.

Lungs/Pleura: Diffuse peripheral predominant ground-glass opacities
throughout both lungs typical of COVID pneumonia. There is scattered
central bronchial thickening. Mild apical predominant emphysema. No
pneumothorax or pleural fluid.

Upper Abdomen: No acute or unexpected findings.

Musculoskeletal: There are no acute or suspicious osseous
abnormalities.

Review of the MIP images confirms the above findings.
IMPRESSION: 1. No pulmonary embolus.
2. Diffuse peripheral predominant ground-glass opacities throughout
both lungs typical of COVID pneumonia.

Aortic Atherosclerosis ([id number]-[id number].R) and Emphysema ([id number].H).

## 2022-03-23 DIAGNOSIS — Z299 Encounter for prophylactic measures, unspecified: Secondary | ICD-10-CM | POA: Diagnosis not present

## 2022-03-23 DIAGNOSIS — U099 Post covid-19 condition, unspecified: Secondary | ICD-10-CM | POA: Diagnosis not present

## 2022-03-23 DIAGNOSIS — I10 Essential (primary) hypertension: Secondary | ICD-10-CM | POA: Diagnosis not present

## 2022-03-23 DIAGNOSIS — J449 Chronic obstructive pulmonary disease, unspecified: Secondary | ICD-10-CM | POA: Diagnosis not present

## 2022-08-22 DIAGNOSIS — Z299 Encounter for prophylactic measures, unspecified: Secondary | ICD-10-CM | POA: Diagnosis not present

## 2022-08-22 DIAGNOSIS — Z87891 Personal history of nicotine dependence: Secondary | ICD-10-CM | POA: Diagnosis not present

## 2022-08-22 DIAGNOSIS — R5383 Other fatigue: Secondary | ICD-10-CM | POA: Diagnosis not present

## 2022-08-22 DIAGNOSIS — Z79899 Other long term (current) drug therapy: Secondary | ICD-10-CM | POA: Diagnosis not present

## 2022-08-22 DIAGNOSIS — Z7189 Other specified counseling: Secondary | ICD-10-CM | POA: Diagnosis not present

## 2022-08-22 DIAGNOSIS — E7800 Pure hypercholesterolemia, unspecified: Secondary | ICD-10-CM | POA: Diagnosis not present

## 2022-08-22 DIAGNOSIS — Z0000 Encounter for general adult medical examination without abnormal findings: Secondary | ICD-10-CM | POA: Diagnosis not present

## 2022-08-22 DIAGNOSIS — E039 Hypothyroidism, unspecified: Secondary | ICD-10-CM | POA: Diagnosis not present

## 2022-08-22 DIAGNOSIS — I10 Essential (primary) hypertension: Secondary | ICD-10-CM | POA: Diagnosis not present

## 2022-09-27 DIAGNOSIS — D692 Other nonthrombocytopenic purpura: Secondary | ICD-10-CM | POA: Diagnosis not present

## 2022-09-27 DIAGNOSIS — I700 Atherosclerosis of aorta: Secondary | ICD-10-CM | POA: Diagnosis not present

## 2022-09-27 DIAGNOSIS — J449 Chronic obstructive pulmonary disease, unspecified: Secondary | ICD-10-CM | POA: Diagnosis not present

## 2022-09-27 DIAGNOSIS — J069 Acute upper respiratory infection, unspecified: Secondary | ICD-10-CM | POA: Diagnosis not present

## 2023-01-19 DIAGNOSIS — I10 Essential (primary) hypertension: Secondary | ICD-10-CM | POA: Diagnosis not present

## 2023-01-19 DIAGNOSIS — Z0000 Encounter for general adult medical examination without abnormal findings: Secondary | ICD-10-CM | POA: Diagnosis not present

## 2023-01-19 DIAGNOSIS — Z299 Encounter for prophylactic measures, unspecified: Secondary | ICD-10-CM | POA: Diagnosis not present

## 2023-01-19 DIAGNOSIS — J449 Chronic obstructive pulmonary disease, unspecified: Secondary | ICD-10-CM | POA: Diagnosis not present

## 2023-01-19 DIAGNOSIS — I700 Atherosclerosis of aorta: Secondary | ICD-10-CM | POA: Diagnosis not present

## 2023-01-19 DIAGNOSIS — E039 Hypothyroidism, unspecified: Secondary | ICD-10-CM | POA: Diagnosis not present

## 2023-01-19 DIAGNOSIS — Z7189 Other specified counseling: Secondary | ICD-10-CM | POA: Diagnosis not present

## 2023-01-19 DIAGNOSIS — Z87891 Personal history of nicotine dependence: Secondary | ICD-10-CM | POA: Diagnosis not present

## 2023-04-17 DIAGNOSIS — Z87891 Personal history of nicotine dependence: Secondary | ICD-10-CM | POA: Diagnosis not present

## 2023-04-17 DIAGNOSIS — I10 Essential (primary) hypertension: Secondary | ICD-10-CM | POA: Diagnosis not present

## 2023-04-17 DIAGNOSIS — Z0000 Encounter for general adult medical examination without abnormal findings: Secondary | ICD-10-CM | POA: Diagnosis not present

## 2023-04-17 DIAGNOSIS — Z79899 Other long term (current) drug therapy: Secondary | ICD-10-CM | POA: Diagnosis not present

## 2023-04-17 DIAGNOSIS — R5383 Other fatigue: Secondary | ICD-10-CM | POA: Diagnosis not present

## 2023-04-17 DIAGNOSIS — E7800 Pure hypercholesterolemia, unspecified: Secondary | ICD-10-CM | POA: Diagnosis not present

## 2023-04-17 DIAGNOSIS — Z299 Encounter for prophylactic measures, unspecified: Secondary | ICD-10-CM | POA: Diagnosis not present

## 2023-04-17 DIAGNOSIS — E039 Hypothyroidism, unspecified: Secondary | ICD-10-CM | POA: Diagnosis not present

## 2023-06-19 DIAGNOSIS — Z23 Encounter for immunization: Secondary | ICD-10-CM | POA: Diagnosis not present

## 2023-06-19 DIAGNOSIS — E039 Hypothyroidism, unspecified: Secondary | ICD-10-CM | POA: Diagnosis not present

## 2023-06-19 DIAGNOSIS — J449 Chronic obstructive pulmonary disease, unspecified: Secondary | ICD-10-CM | POA: Diagnosis not present

## 2023-06-19 DIAGNOSIS — I700 Atherosclerosis of aorta: Secondary | ICD-10-CM | POA: Diagnosis not present

## 2023-06-19 DIAGNOSIS — I10 Essential (primary) hypertension: Secondary | ICD-10-CM | POA: Diagnosis not present

## 2023-06-19 DIAGNOSIS — Z299 Encounter for prophylactic measures, unspecified: Secondary | ICD-10-CM | POA: Diagnosis not present

## 2023-07-10 DIAGNOSIS — Z1231 Encounter for screening mammogram for malignant neoplasm of breast: Secondary | ICD-10-CM | POA: Diagnosis not present

## 2023-09-13 DIAGNOSIS — H2513 Age-related nuclear cataract, bilateral: Secondary | ICD-10-CM | POA: Diagnosis not present

## 2023-09-13 DIAGNOSIS — H40033 Anatomical narrow angle, bilateral: Secondary | ICD-10-CM | POA: Diagnosis not present

## 2023-10-18 DIAGNOSIS — H10013 Acute follicular conjunctivitis, bilateral: Secondary | ICD-10-CM | POA: Diagnosis not present

## 2024-02-01 DIAGNOSIS — Z7189 Other specified counseling: Secondary | ICD-10-CM | POA: Diagnosis not present

## 2024-02-01 DIAGNOSIS — Z0000 Encounter for general adult medical examination without abnormal findings: Secondary | ICD-10-CM | POA: Diagnosis not present

## 2024-02-01 DIAGNOSIS — Z299 Encounter for prophylactic measures, unspecified: Secondary | ICD-10-CM | POA: Diagnosis not present

## 2024-02-01 DIAGNOSIS — R5383 Other fatigue: Secondary | ICD-10-CM | POA: Diagnosis not present

## 2024-02-01 DIAGNOSIS — Z713 Dietary counseling and surveillance: Secondary | ICD-10-CM | POA: Diagnosis not present

## 2024-02-01 DIAGNOSIS — Z1389 Encounter for screening for other disorder: Secondary | ICD-10-CM | POA: Diagnosis not present

## 2024-02-01 DIAGNOSIS — I10 Essential (primary) hypertension: Secondary | ICD-10-CM | POA: Diagnosis not present

## 2024-04-17 DIAGNOSIS — E559 Vitamin D deficiency, unspecified: Secondary | ICD-10-CM | POA: Diagnosis not present

## 2024-04-17 DIAGNOSIS — Z0000 Encounter for general adult medical examination without abnormal findings: Secondary | ICD-10-CM | POA: Diagnosis not present

## 2024-04-17 DIAGNOSIS — E7800 Pure hypercholesterolemia, unspecified: Secondary | ICD-10-CM | POA: Diagnosis not present

## 2024-04-17 DIAGNOSIS — R5383 Other fatigue: Secondary | ICD-10-CM | POA: Diagnosis not present

## 2024-04-17 DIAGNOSIS — I10 Essential (primary) hypertension: Secondary | ICD-10-CM | POA: Diagnosis not present

## 2024-04-17 DIAGNOSIS — Z299 Encounter for prophylactic measures, unspecified: Secondary | ICD-10-CM | POA: Diagnosis not present

## 2024-04-17 DIAGNOSIS — Z79899 Other long term (current) drug therapy: Secondary | ICD-10-CM | POA: Diagnosis not present
